# Patient Record
Sex: Female | Born: 1960 | Race: White | Hispanic: No | Marital: Married | Smoking: Never smoker
Health system: Southern US, Community
[De-identification: ages and names within clinical notes are randomized; demographics above are authoritative.]

## PROBLEM LIST (undated history)

## (undated) DIAGNOSIS — I10 Essential (primary) hypertension: Secondary | ICD-10-CM

## (undated) DIAGNOSIS — F32A Depression, unspecified: Secondary | ICD-10-CM

## (undated) DIAGNOSIS — N816 Rectocele: Secondary | ICD-10-CM

## (undated) DIAGNOSIS — G4700 Insomnia, unspecified: Secondary | ICD-10-CM

## (undated) DIAGNOSIS — F329 Major depressive disorder, single episode, unspecified: Secondary | ICD-10-CM

## (undated) DIAGNOSIS — R51 Headache: Secondary | ICD-10-CM

## (undated) DIAGNOSIS — R519 Headache, unspecified: Secondary | ICD-10-CM

## (undated) DIAGNOSIS — E162 Hypoglycemia, unspecified: Secondary | ICD-10-CM

## (undated) DIAGNOSIS — K589 Irritable bowel syndrome without diarrhea: Secondary | ICD-10-CM

## (undated) DIAGNOSIS — R609 Edema, unspecified: Secondary | ICD-10-CM

## (undated) DIAGNOSIS — K219 Gastro-esophageal reflux disease without esophagitis: Secondary | ICD-10-CM

## (undated) DIAGNOSIS — J309 Allergic rhinitis, unspecified: Secondary | ICD-10-CM

## (undated) HISTORY — DX: Major depressive disorder, single episode, unspecified: F32.9

## (undated) HISTORY — PX: APPENDECTOMY: SHX54

## (undated) HISTORY — DX: Depression, unspecified: F32.A

## (undated) HISTORY — DX: Irritable bowel syndrome, unspecified: K58.9

## (undated) HISTORY — DX: Hypoglycemia, unspecified: E16.2

## (undated) HISTORY — DX: Allergic rhinitis, unspecified: J30.9

## (undated) HISTORY — DX: Insomnia, unspecified: G47.00

## (undated) HISTORY — PX: OTHER SURGICAL HISTORY: SHX169

## (undated) HISTORY — DX: Rectocele: N81.6

## (undated) HISTORY — DX: Edema, unspecified: R60.9

## (undated) HISTORY — DX: Gastro-esophageal reflux disease without esophagitis: K21.9

## (undated) HISTORY — DX: Headache, unspecified: R51.9

## (undated) HISTORY — DX: Headache: R51

## (undated) HISTORY — DX: Essential (primary) hypertension: I10

---

## 1992-02-26 HISTORY — PX: FEMUR FRACTURE SURGERY: SHX633

## 1997-11-12 ENCOUNTER — Emergency Department (HOSPITAL_COMMUNITY): Admission: EM | Admit: 1997-11-12 | Discharge: 1997-11-12 | Payer: Self-pay | Admitting: Emergency Medicine

## 2001-02-25 HISTORY — PX: CHOLECYSTECTOMY: SHX55

## 2001-12-14 ENCOUNTER — Emergency Department (HOSPITAL_COMMUNITY): Admission: EM | Admit: 2001-12-14 | Discharge: 2001-12-15 | Payer: Self-pay | Admitting: Emergency Medicine

## 2001-12-15 ENCOUNTER — Encounter: Payer: Self-pay | Admitting: Surgery

## 2001-12-15 ENCOUNTER — Encounter: Payer: Self-pay | Admitting: Emergency Medicine

## 2001-12-15 ENCOUNTER — Encounter (INDEPENDENT_AMBULATORY_CARE_PROVIDER_SITE_OTHER): Payer: Self-pay | Admitting: Specialist

## 2001-12-15 ENCOUNTER — Inpatient Hospital Stay (HOSPITAL_COMMUNITY): Admission: EM | Admit: 2001-12-15 | Discharge: 2001-12-18 | Payer: Self-pay | Admitting: Emergency Medicine

## 2001-12-16 ENCOUNTER — Encounter: Payer: Self-pay | Admitting: Gastroenterology

## 2003-08-25 ENCOUNTER — Other Ambulatory Visit: Admission: RE | Admit: 2003-08-25 | Discharge: 2003-08-25 | Payer: Self-pay | Admitting: Family Medicine

## 2004-06-21 ENCOUNTER — Ambulatory Visit: Payer: Self-pay | Admitting: Gastroenterology

## 2004-07-25 ENCOUNTER — Ambulatory Visit: Payer: Self-pay | Admitting: Gastroenterology

## 2004-12-23 ENCOUNTER — Encounter: Admission: RE | Admit: 2004-12-23 | Discharge: 2004-12-23 | Payer: Self-pay | Admitting: *Deleted

## 2005-07-25 ENCOUNTER — Emergency Department (HOSPITAL_COMMUNITY): Admission: EM | Admit: 2005-07-25 | Discharge: 2005-07-25 | Payer: Self-pay | Admitting: Emergency Medicine

## 2005-09-10 ENCOUNTER — Ambulatory Visit (HOSPITAL_BASED_OUTPATIENT_CLINIC_OR_DEPARTMENT_OTHER): Admission: RE | Admit: 2005-09-10 | Discharge: 2005-09-10 | Payer: Self-pay | Admitting: Orthopaedic Surgery

## 2006-07-14 ENCOUNTER — Encounter: Admission: RE | Admit: 2006-07-14 | Discharge: 2006-07-14 | Payer: Self-pay | Admitting: Family Medicine

## 2009-01-04 ENCOUNTER — Ambulatory Visit: Payer: Self-pay | Admitting: Family Medicine

## 2009-01-08 ENCOUNTER — Emergency Department (HOSPITAL_BASED_OUTPATIENT_CLINIC_OR_DEPARTMENT_OTHER): Admission: EM | Admit: 2009-01-08 | Discharge: 2009-01-08 | Payer: Self-pay | Admitting: Emergency Medicine

## 2009-01-08 ENCOUNTER — Ambulatory Visit: Payer: Self-pay | Admitting: Diagnostic Radiology

## 2009-06-23 ENCOUNTER — Encounter (INDEPENDENT_AMBULATORY_CARE_PROVIDER_SITE_OTHER): Payer: Self-pay | Admitting: *Deleted

## 2010-03-29 NOTE — Letter (Signed)
Summary: Colonoscopy Letter  [facility]  [address] [place], [place] [address]   Phone: [phone]  Fax: [phone]      June 23, 2009 MRN: [id number]   [facility] [address] [place], [place]  [address]   Dear Ms. Caroline Davis,   According to your medical record, it is time for you to schedule a Colonoscopy. The American Cancer Society recommends this procedure as a method to detect early colon cancer. Patients with a family history of colon cancer, or a personal history of colon polyps or inflammatory bowel disease are at increased risk.  This letter has beeen generated based on the recommendations made at the time of your procedure. If you feel that in your particular situation this may no longer apply, please contact our office.  Please call our office at [phone] to schedule this appointment or to update your records at your earliest convenience.  Thank you for cooperating with [place] to provide you with the very best care possible.   Sincerely,  Judie Petit T. Russella Dar, M.D.  [facility] [phone]
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
## 2010-07-13 NOTE — Op Note (Signed)
NAMELAURABELLE, GORCZYCA                          ACCOUNT NO.:  [id number]   MEDICAL RECORD NO.:  [id number]                   PATIENT TYPE:  OBV   LOCATION:  0369                                 FACILITY:  [facility]   PHYSICIAN:  Velora Heckler, M.D.                DATE OF BIRTH:  May 02, 1960   DATE OF PROCEDURE:  12/15/2001  DATE OF DISCHARGE:                                 OPERATIVE REPORT   PREOPERATIVE DIAGNOSIS:  Unrelenting biliary colic, cholelithiasis, rule out  choledocholithiasis.   POSTOPERATIVE DIAGNOSIS:  Unrelenting biliary colic, cholelithiasis,  choledocholithiasis.   PROCEDURE:  Laparoscopic cholecystectomy with intraoperative  cholangiography.   SURGEON:  Velora Heckler, M.D.   ANESTHESIA:  General.   ESTIMATED BLOOD LOSS:  100 cc.   PREPARATION:  Betadine.   COMPLICATIONS:  None.   INDICATIONS:  The patient is a 50 year old white female who presents to the  emergency department with a two-week history of intermittent epigastric  abdominal pain.  Pain has been largely unrelenting over the past 24 hours.  She was seen on 10/20 in the emergency department at [facility].  Laboratory studies were normal.  The patient was given pain medication and  asked to return for an ultrasound.  The patient returned on 10/21 and  underwent abdominal ultrasound showing multiple gallstones.  Repeat  laboratory studies, however, showed elevated liver function tests.  General  surgery was consulted.  The patient was seen and prepared for the operating  room.   DESCRIPTION OF PROCEDURE:  The procedure was done in OR #1 at the [facility].  The patient was brought to the operating room and  placed in a supine position on the operating room table.  Following  administration of general anesthesia, the patient was prepped and draped in  the usual strict aseptic fashion.  After ascertaining that an adequate level  of anesthesia had been obtained, an  infraumbilical incision is made in the  midline with a #15 blade.  Dissection is carried down to the fascia.  The  fascia is incised in the midline, and the peritoneal cavity is entered  cautiously.  A 0 Vicryl pursestring suture is placed in the fascia.  A  Hasson cannula is introduced under direct vision and secured with a  pursestring suture.  The abdomen is insufflated with carbon dioxide.  Laparoscope is introduced and the abdomen explored.  Operative ports are  placed along the right costal margin in the midline, midclavicular line, and  anterior axillary line.  Fundus of the gallbladder is grasped and retracted  cephalad.  There is some edema of the gallbladder wall.  There are no  adhesions.  Dissection is begun at the neck of the gallbladder.  The cystic  duct is dissected out along its length.  A clip is placed at the neck of the  gallbladder.  Cystic duct is incised.  A Cook cholangiography catheter is  introduced into the peritoneal cavity through a stab wound in the right  upper quadrant.  It is inserted into the cystic duct and secured with a  Ligaclip.  Using C-arm fluoroscopy, real-time cholangiography is performed.  There is rapid filling of a normal-caliber common bile duct.  There is free  flow into the duodenum.  There is reflux of contrast into the right and left  ductal systems.  However, there is approximately a 3 mm filling defect in  the mid-common bile duct.  This is distal to the takeoff of the cystic duct.  Due to the cystic duct entering the common bile duct at a very acute angle,  it is technically difficult if not impossible to perform a laparoscopic  common bile duct exploration.  Therefore, no attempt is made to retrieve the  stone.  The clip and Cook catheter are withdrawn.  The cystic duct is triply  clipped and divided.  The cystic artery is dissected out along its length,  doubly clipped, and divided.  The gallbladder is then excised from the   gallbladder bed using the hook electrocautery for hemostasis.  At one point  in the upper midportion of the gallbladder bed, a portal venous radicle is  entered.  Hemostasis was not adequately obtained with electrocautery alone.  Therefore, a large Ligaclip is placed.  Two additional Ligaclip's are placed  with adequate control of bleeding.  Bleeding is watched carefully for  several minutes, and then a piece of Surgicel was placed into the  gallbladder bed.  A 19 [place] Blake drain is also placed beneath the  gallbladder bed to monitor for any further bleeding.  The drain is brought  out through a lateral port site and secured to the skin with a 3-0 nylon  suture.  The right upper quadrant is copiously irrigated with warm saline,  which is evacuated.  Good hemostasis is noted.  The gallbladder was  completely excised and extracted through the umbilical port without  difficulty.  It contains multiple gallstones.  The umbilical wound is closed  with a 0 Vicryl pursestring suture.  Pneumoperitoneum is released, and ports  are removed under direct visualization with good hemostasis noted at port  sites.  Port sites are anesthetized with local anesthetic.  Wounds are  closed with interrupted 4-0 Vicryl subcuticular sutures.  Blake drain is  placed to bulb suction.  Steri-Strips are applied to abdominal wounds.  Dressings are applied.  The patient is awakened from anesthesia and brought  to the recovery room in stable condition.  The patient tolerated the  procedure well.  Gastroenterology will be consulted to evaluate for need for  ERCP and stone extraction.                                               Velora Heckler, M.D.    TMG/MEDQ  D:  12/15/2001  T:  12/16/2001  Job:  [id number]

## 2010-07-13 NOTE — Op Note (Signed)
Caroline Davis, Caroline Davis              ACCOUNT NO.:  [id number]   MEDICAL RECORD NO.:  [id number]          PATIENT TYPE:  AMB   LOCATION:  DSC                          FACILITY:  [facility]   PHYSICIAN:  Lubertha Basque. Dalldorf, M.D.DATE OF BIRTH:  05/12/1960   DATE OF PROCEDURE:  09/10/2005  DATE OF DISCHARGE:                                 OPERATIVE REPORT   PREOPERATIVE DIAGNOSES:  1.  Right shoulder impingement.  2.  Right shoulder rotator cuff tear.   POSTOPERATIVE DIAGNOSES:  1.  Right shoulder impingement.  2.  Right shoulder rotator cuff tear.   PROCEDURE:  1.  Right shoulder arthroscopic acromioplasty.  2.  Right shoulder arthroscopic rotator cuff repair.   ANESTHESIA:  General and block.   ATTENDING SURGEON:  Lubertha Basque. Jerl Santos, M.D.   ASSISTANT:  Lindwood Qua, P.A.   INDICATIONS FOR PROCEDURE:  The patient is a 50 year old woman with a long  history of right shoulder pain.  She underwent an MRI scan which shows a  retracted rotator cuff tear.  She subsequently fell again.  At this point  she cannot bring her arm up overhead.  She is suspected to have an even  larger rotator cuff tear.  She has pain which limits her ability to rest and  obviously cannot use her arm.  At this point she is offered an arthroscopy  with repair of rotator cuff tear.  Informed operative consent was obtained  after discussion of the possible complications of reaction to anesthesia,  infection, failure of repair, and shoulder stiffness.   SUMMARY OF FINDINGS AND PROCEDURE:  Under general anesthesia and a block  through three portals, an arthroscopy of the right shoulder was performed.  The glenohumeral joint showed no degenerative changes and the biceps tendon  appeared normal in its native position.  The labral structures were all  intact.  She had an obvious large rotator cuff tear which was retracted but  very mobile.  Her tissues seemed good.  I performed a repair  arthroscopically with four  sutures.  I also performed an acromioplasty to  decompress the area.  Bryna Colander assisted throughout and was invaluable  to the completion of this case in that he held arthroscopic equipment so I  could pass sutures and tie knots.  He also closed portals simultaneously to  minimize OR time.   DESCRIPTION OF PROCEDURE:  The patient was taken to an operating suite where  general anesthetic was applied without difficulty.  She was also given a  block in the preanesthesia area.  She was positioned in beach-chair position  and prepped and draped in normal sterile fashion.  After administration of  preop IV Kefzol, an arthroscopy of the right shoulder was performed through  a total of three portals.  The findings were as noted above.  The procedure  consisted of an acromioplasty done through the lateral portal followed by  transfer of the bur to the posterior portal.  She had a moderately prominent  subacromial morphology.  The [facility] joint appeared benign and was not addressed.  She did have the aforementioned rotator cuff  tear which was retracted a  centimeter or two but very mobile.  I freed this up slightly.  I created a  bleeding bed of bone with a bur sacrificing about 5 or 6 mm of articular  cartilage so as to medialize this slightly and remove all tension off the  repair.  I then placed two of the Arthrex anchors 5.5 mm about a centimeter  apart.  A total of four sutures emanated from these anchors.  I then used  the Scorpion device to pass four limbs of these sutures through the rotator  cuff in appropriate position to reapproximate the cuff to the bleeding bed  of bone which we had created.  I then tied each of these sutures  sequentially and brought the cuff into the appropriate position.  We seemed  to achieve a nice stable repair.  Bryna Colander assisted throughout.  The  shoulder was again examined arthroscopically and the equipment was removed.  The shoulder was thoroughly  irrigated prior to removal of the equipment.  Simple sutures of nylon were used to loosely reapproximate the portals  followed by Adaptic and dry gauze dressing with tape.  Estimated blood loss  and intraoperative fluids can be obtained from anesthesia records.   DISPOSITION:  The patient was extubated in the operating room and taken to  recovery room in stable addition.  She was to go home same-day and to follow  up in the office in less than a week.  I will contact her by phone tonight.      Lubertha Basque Jerl Santos, M.D.  Electronically Signed     PGD/MEDQ  D:  09/10/2005  T:  09/10/2005  Job:  [id number]

## 2010-07-13 NOTE — Discharge Summary (Signed)
   Caroline Davis, Caroline Davis                          ACCOUNT NO.:  [id number]   MEDICAL RECORD NO.:  [id number]                   PATIENT TYPE:  INP   LOCATION:  0369                                 FACILITY:  [facility]   PHYSICIAN:  Velora Heckler, M.D.                DATE OF BIRTH:  1960/11/10   DATE OF ADMISSION:  12/15/2001  DATE OF DISCHARGE:  12/18/2001                                 DISCHARGE SUMMARY   REASON FOR ADMISSION:  Abdominal pain.   BRIEF HISTORY:  The patient is a 50 year old white female who presents with  two week history of right upper quadrant and epigastric abdominal pain.  This has been intermittent.  Over the past 24 hours it has become  unrelenting.  The patient presented to the emergency department on the day  of admission.  She was noted to have elevated liver function tests.  Ultrasound of the abdomen demonstrated multiple gallstones.  General surgery  was consulted and the patient was admitted on the general surgical service.   HOSPITAL COURSE:  The patient was admitted and prepared for the operating  room.  She was taken to the operating room on the evening of December 15, 2001.  She underwent laparoscopic cholecystectomy with intraoperative  cholangiography.  The patient was found at surgery on cholangiography to  have what appeared to be a 3mm gallstone in the mid common bile duct without  significant ductal dilatation.  Postoperatively the patient was seen in  consultation by gastroenterology.  She was prepared and taken to the  endoscopy suite on October 22 where she underwent ERCP with sphincterotomy.  The patient did well and her diet was advanced.  She was prepared for  discharge home on the third postoperative day.   DISCHARGE PLANNING:  The patient is discharged home December 18, 2001 in good  condition, tolerating a regular diet, and ambulating independently.  Discharge medications include Vicodin as needed for pain.  The patient will  return to see  me at [facility] in three weeks.   FINAL DIAGNOSES:  1. Acute cholecystitis.  2. Cholelithiasis.  3. Choledocholithiasis.   CONDITION ON DISCHARGE:  Improved.                                               Velora Heckler, M.D.    TMG/MEDQ  D:  12/24/2001  T:  12/24/2001  Job:  [id number]   cc:   Everardo All. Madilyn Fireman, M.D.  [address], [address]  [place]  [place] [address]  Fax: [phone]

## 2010-07-13 NOTE — Op Note (Signed)
Caroline Davis, Caroline Davis                          ACCOUNT NO.:  [id number]   MEDICAL RECORD NO.:  [id number]                   PATIENT TYPE:  OBV   LOCATION:  0369                                 [facility]ACILITY[facility]  [facility]   PHYSICIAN:  John C. Madilyn Fireman, M.D.                 DATE OF BIRTH:  May 17, 1960   DATE OF PROCEDURE:  12/16/2001  DATE OF DISCHARGE:                                 OPERATIVE REPORT   PROCEDURE:  Endoscopic retrograde cholangiopancreatography with  sphincterotomy.   INDICATIONS FOR PROCEDURE:  Common bile duct stone versus air bubble seen at  time of laparoscopic cholecystectomy yesterday for symptomatic  cholecystitis.  She also had elevated liver function tests.   PROCEDURE:  The patient was placed in the prone position and placed on the  pulse monitor with continuous low-flow oxygen delivered by nasal cannula.  She was sedated with 60 mg IV Demerol and 7 mg IV Versed and also given 0.4  mg IM atropine prior to the procedure.  The Olympus side-viewing endoscope  was advanced blindly into the oropharynx, esophagus, and stomach.  The  duodenum was entered and the ampulla of Vater located on the medially  duodenal wall.  It had a normal appearance.  Initial cannulation attempt  with the Wilson-Cook sphinctertome resulted in a show cannulation and  initial dye injection opacified a normal-appearing pancreatic duct.  With  use of the guidewire, the common bile duct was selectively cannulated and  cholangiogram obtained which initially revealed no obvious filling defect.  Based on the relatively strong clinical suspicion of stone, I went ahead and  performed sphincterotomy.  During live imaging of fluoroscopy, there  appeared to be a possible filling defect in the distal duct, but I could not  be certain that it was not an air bubble occurring after the sphincterotomy.  The common bile duct and intrahepatic ducts generally appeared normal.  The  sphinctertome was withdrawn and  replaced with  8.5-mm balloon catheter.  The catheter was then passed high into the common  hepatic duct and at this point, two or three small filling defects were seen  which were probably air bubbles.  Four balloon sweeps were made and only  bubbles were seen to be delivered into the duodenum with no stones seen.  The final cholangiogram was obtained which showed two or three additional  large air bubbles but no other obvious filling defects and one more balloon  sweep was made with no stones seen.  The scope was then withdrawn and the  patient returned to the recovery room in stable condition.  She tolerated  the procedure well and there were no immediate complications.   IMPRESSION:  Question of small common bile duct stone or possibly air  bubble, status post sphincterotomy and several balloon sweeps.   PLAN:  1. Recheck liver function tests tomorrow.  2. Advance diet as tolerated.  John C. Madilyn Fireman, M.D.    JCH/MEDQ  D:  12/16/2001  T:  12/16/2001  Job:  [id number]   cc:   Velora Heckler, M.D.  Fax: [phone]

## 2010-09-10 ENCOUNTER — Other Ambulatory Visit: Payer: Self-pay | Admitting: Family Medicine

## 2010-09-10 DIAGNOSIS — Z1231 Encounter for screening mammogram for malignant neoplasm of breast: Secondary | ICD-10-CM

## 2010-09-13 ENCOUNTER — Ambulatory Visit: Payer: Self-pay

## 2010-09-14 ENCOUNTER — Telehealth: Payer: Self-pay

## 2010-09-17 NOTE — Telephone Encounter (Signed)
Pt. returned call and said she will have to check her sch'd and call back to sch'd COL.

## 2012-11-10 ENCOUNTER — Ambulatory Visit
Admission: RE | Admit: 2012-11-10 | Discharge: 2012-11-10 | Disposition: A | Discharge status: Home / Self Care | Payer: 59 | Source: Ambulatory Visit | Attending: Family Medicine | Admitting: Family Medicine

## 2012-11-10 ENCOUNTER — Other Ambulatory Visit: Payer: Self-pay | Admitting: Family Medicine

## 2012-11-10 DIAGNOSIS — R0789 Other chest pain: Secondary | ICD-10-CM

## 2012-11-20 ENCOUNTER — Encounter: Payer: Self-pay | Admitting: Cardiovascular Disease

## 2012-11-20 ENCOUNTER — Other Ambulatory Visit: Payer: Self-pay | Admitting: *Deleted

## 2012-11-20 ENCOUNTER — Encounter: Payer: Self-pay | Admitting: *Deleted

## 2012-11-20 ENCOUNTER — Ambulatory Visit (INDEPENDENT_AMBULATORY_CARE_PROVIDER_SITE_OTHER): Payer: 59 | Admitting: Cardiovascular Disease

## 2012-11-20 VITALS — BP 162/90 | HR 72 | Ht 64.0 in | Wt 149.0 lb

## 2012-11-20 DIAGNOSIS — N816 Rectocele: Secondary | ICD-10-CM | POA: Insufficient documentation

## 2012-11-20 DIAGNOSIS — I10 Essential (primary) hypertension: Secondary | ICD-10-CM

## 2012-11-20 DIAGNOSIS — R609 Edema, unspecified: Secondary | ICD-10-CM | POA: Insufficient documentation

## 2012-11-20 DIAGNOSIS — J309 Allergic rhinitis, unspecified: Secondary | ICD-10-CM | POA: Insufficient documentation

## 2012-11-20 DIAGNOSIS — F329 Major depressive disorder, single episode, unspecified: Secondary | ICD-10-CM

## 2012-11-20 DIAGNOSIS — R0600 Dyspnea, unspecified: Secondary | ICD-10-CM

## 2012-11-20 DIAGNOSIS — K219 Gastro-esophageal reflux disease without esophagitis: Secondary | ICD-10-CM | POA: Insufficient documentation

## 2012-11-20 DIAGNOSIS — F32A Depression, unspecified: Secondary | ICD-10-CM | POA: Insufficient documentation

## 2012-11-20 DIAGNOSIS — K589 Irritable bowel syndrome without diarrhea: Secondary | ICD-10-CM | POA: Insufficient documentation

## 2012-11-20 DIAGNOSIS — E162 Hypoglycemia, unspecified: Secondary | ICD-10-CM | POA: Insufficient documentation

## 2012-11-20 DIAGNOSIS — G4700 Insomnia, unspecified: Secondary | ICD-10-CM | POA: Insufficient documentation

## 2012-11-20 DIAGNOSIS — R51 Headache: Secondary | ICD-10-CM

## 2012-11-20 MED ORDER — METHYLDOPA 250 MG PO TABS: 250.0000 mg | ORAL_TABLET | Freq: Two times a day (BID) | ORAL | Status: DC

## 2012-11-20 NOTE — Assessment & Plan Note (Signed)
On SNRI and may be better to change to SSRI with BP issues

## 2012-11-20 NOTE — Progress Notes (Signed)
Patient ID: Caroline Davis, female   DOB: 09/10/1960, 52 y.o.   MRN: [id number] 52 yo with long standing HTN.  Not clear what meds she has been on or w/u in past.  Rx since high school days.  Works at [employer].  Wakes up at 2:00 and works 3rd shift.  Only takes Diovan HCTZ.  Is intolerant to beta blockers with flat affect.  Has had marked edema with norvasc.  She does not have excess salt or ETOH.  No chronic kidney disease.  Some depression on effexor.  K runs low on diuretic and she suppl.  No known vascular diseae   ROS: Denies fever, malais, weight loss, blurry vision, decreased visual acuity, cough, sputum, SOB, hemoptysis, pleuritic pain, palpitaitons, heartburn, abdominal pain, melena, lower extremity edema, claudication, or rash.  All other systems reviewed and negative   General: Affect appropriate Healthy:  appears stated age HEENT: normal Neck supple with no adenopathy JVP normal no bruits no thyromegaly Lungs clear with no wheezing and good diaphragmatic motion Heart:  S1/S2 no murmur,rub, gallop or click PMI normal Abdomen: benighn, BS positve, no tenderness, no AAA no bruit.  No HSM or HJR Distal pulses intact with no bruits No edema Neuro non-focal Skin warm and dry No muscular weakness  Medications Current Outpatient Prescriptions  Medication Sig Dispense Refill  . pantoprazole (PROTONIX) 40 MG tablet Take 40 mg by mouth daily.      . potassium chloride (MICRO-K) 10 MEQ CR capsule Take 10 mEq by mouth 2 (two) times daily.      . valsartan-hydrochlorothiazide (DIOVAN-HCT) 320-25 MG per tablet Take 1 tablet by mouth daily.      Marland Kitchen venlafaxine (EFFEXOR) 75 MG tablet Take 75 mg by mouth 2 (two) times daily.      Marland Kitchen zolpidem (AMBIEN) 10 MG tablet Take 10 mg by mouth at bedtime as needed for sleep.       No current facility-administered medications for this visit.    Allergies Review of patient's allergies indicates not on file.  Family History: No family history on  file.  Social History: History   Social History  . Marital Status: Single    Spouse Name: N/A    Number of Children: N/A  . Years of Education: N/A   Occupational History  . Not on file.   Social History Main Topics  . Smoking status: Never Smoker   . Smokeless tobacco: Not on file  . Alcohol Use: No  . Drug Use: No  . Sexual Activity: Not on file   Other Topics Concern  . Not on file   Social History Narrative  . No narrative on file    Electrocardiogram:  NSR rate 72 normal with no LVH  Assessment and Plan

## 2012-11-20 NOTE — Assessment & Plan Note (Signed)
Check urine for pheo and renal duplex  Add aldomet .  If she appears better controlled can f/u with 24 hour ambulatory monitoring to make sure

## 2012-11-20 NOTE — Patient Instructions (Addendum)
Your physician recommends that you schedule a follow-up appointment in: NEXT AVAILABLE Your physician has recommended you make the following change in your medication:  WILL CALL LATER WITH  NAME OF NEW  MED Your physician has requested that you have a renal artery duplex. During this test, an ultrasound is used to evaluate blood flow to the kidneys. Allow one hour for this exam. Do not eat after midnight the day before and avoid carbonated beverages. Take your medications as you usually do.  Your physician recommends that you return for lab work in:   24 HOUR  URINE  WITH META  AND CATA Your physician has requested that you have an echocardiogram. Echocardiography is a painless test that uses sound waves to create images of your heart. It provides your doctor with information about the size and shape of your heart and how well your heart's chambers and valves are working. This procedure takes approximately one hour. There are no restrictions for this procedure.

## 2012-11-20 NOTE — Assessment & Plan Note (Signed)
Related to norvasc  Try to avoid calcium blockers if possible  On low dose diuretic

## 2012-11-25 ENCOUNTER — Other Ambulatory Visit: Payer: 59

## 2012-11-25 ENCOUNTER — Telehealth: Payer: Self-pay | Admitting: *Deleted

## 2012-11-25 NOTE — Telephone Encounter (Signed)
LMTCB  RE  IF  URINE HAS  BEEN DONE .Caroline Davis

## 2012-11-25 NOTE — Telephone Encounter (Signed)
Message copied by Alois Cliche on Wed Nov 25, 2012 10:34 AM ------      Message from: Wendall Stade      Created: Wed Nov 25, 2012 12:16 AM       ? Has this been done      ----- Message -----         From: SYSTEM         Sent: 11/25/2012  12:08 AM           To: Wendall Stade, MD                   ------

## 2012-11-26 NOTE — Telephone Encounter (Signed)
LMTCB ./CY 

## 2012-12-01 ENCOUNTER — Other Ambulatory Visit: Payer: 59

## 2012-12-01 DIAGNOSIS — I10 Essential (primary) hypertension: Secondary | ICD-10-CM

## 2012-12-03 ENCOUNTER — Ambulatory Visit (HOSPITAL_COMMUNITY): Payer: 59 | Attending: Cardiovascular Disease

## 2012-12-03 DIAGNOSIS — I10 Essential (primary) hypertension: Secondary | ICD-10-CM | POA: Insufficient documentation

## 2012-12-04 ENCOUNTER — Ambulatory Visit (HOSPITAL_COMMUNITY): Payer: 59 | Attending: Cardiovascular Disease | Admitting: Radiology

## 2012-12-04 ENCOUNTER — Other Ambulatory Visit (HOSPITAL_COMMUNITY): Payer: Self-pay | Admitting: Cardiovascular Disease

## 2012-12-04 DIAGNOSIS — R0989 Other specified symptoms and signs involving the circulatory and respiratory systems: Secondary | ICD-10-CM | POA: Insufficient documentation

## 2012-12-04 DIAGNOSIS — R0602 Shortness of breath: Secondary | ICD-10-CM | POA: Insufficient documentation

## 2012-12-04 DIAGNOSIS — R0600 Dyspnea, unspecified: Secondary | ICD-10-CM

## 2012-12-04 DIAGNOSIS — I10 Essential (primary) hypertension: Secondary | ICD-10-CM | POA: Insufficient documentation

## 2012-12-04 DIAGNOSIS — R0609 Other forms of dyspnea: Secondary | ICD-10-CM | POA: Insufficient documentation

## 2012-12-04 NOTE — Progress Notes (Signed)
Echocardiogram performed.  

## 2012-12-06 LAB — METANEPHRINES, URINE, 24 HOUR
Metaneph Total, Ur: 383 mcg/24 h (ref 224–832)
Metanephrines, Ur: 69 mcg/24 h — ABNORMAL LOW (ref 90–315)
Normetanephrine, 24H Ur: 314 mcg/24 h (ref 122–676)

## 2012-12-07 NOTE — Telephone Encounter (Signed)
URINE WAS DONE  SEE LABS./CY

## 2012-12-10 ENCOUNTER — Encounter: Payer: Self-pay | Admitting: *Deleted

## 2012-12-14 ENCOUNTER — Telehealth: Payer: Self-pay | Admitting: Cardiovascular Disease

## 2012-12-14 NOTE — Telephone Encounter (Signed)
Follow up  ° ° °Patient returning call back to nurse  °

## 2012-12-14 NOTE — Telephone Encounter (Signed)
PT  AWARE OF  TEST  RESULTS  ./CY 

## 2012-12-23 ENCOUNTER — Encounter: Payer: Self-pay | Admitting: Cardiovascular Disease

## 2012-12-23 ENCOUNTER — Telehealth: Payer: Self-pay | Admitting: Cardiovascular Disease

## 2012-12-23 ENCOUNTER — Ambulatory Visit (INDEPENDENT_AMBULATORY_CARE_PROVIDER_SITE_OTHER): Payer: 59 | Admitting: Cardiovascular Disease

## 2012-12-23 VITALS — BP 140/85 | HR 72 | Ht 64.0 in | Wt 149.1 lb

## 2012-12-23 DIAGNOSIS — I10 Essential (primary) hypertension: Secondary | ICD-10-CM

## 2012-12-23 DIAGNOSIS — R51 Headache: Secondary | ICD-10-CM

## 2012-12-23 NOTE — Assessment & Plan Note (Signed)
Improved with Aldomet Continue current meds

## 2012-12-23 NOTE — Assessment & Plan Note (Signed)
Discussed use of claritin and nasal washes and avoidence of pseudofed if at all possible

## 2012-12-23 NOTE — Patient Instructions (Signed)
Your physician wants you to follow-up in:  6 MONTHS WITH DR NISHAN  You will receive a reminder letter in the mail two months in advance. If you don't receive a letter, please call our office to schedule the follow-up appointment. Your physician recommends that you continue on your current medications as directed. Please refer to the Current Medication list given to you today. 

## 2012-12-23 NOTE — Telephone Encounter (Signed)
New problem  Patient is scheduled to come in Friday, she is unable to make that appointment because she is moving to [place] Patient wanted to know if she could come in today or earlier on Friday. Please advise

## 2012-12-23 NOTE — Progress Notes (Signed)
Patient ID: Caroline Davis, female   DOB: 03/09/1960, 52 y.o.   MRN: [id number] 52 yo with long standing HTN. Not clear what meds she has been on or w/u in past. Rx since high school days. Works at [employer]. Wakes up at 2:00 and works 3rd shift. Only takes Diovan HCTZ. Is intolerant to beta blockers with flat affect. Has had marked edema with norvasc. She does not have excess salt or ETOH. No chronic kidney disease. Some depression on effexor. K runs low on diuretic and she suppl. No known vascular diseae   Urine negative for pheo Renal duplex normal  Aldomet added and BP improved  Going to [place] to be with parents for 6 months  Occasionally has to use pseudofed for sinus headaches. Cautioned her about this   ROS: Denies fever, malais, weight loss, blurry vision, decreased visual acuity, cough, sputum, SOB, hemoptysis, pleuritic pain, palpitaitons, heartburn, abdominal pain, melena, lower extremity edema, claudication, or rash.  All other systems reviewed and negative  General: Affect appropriate Healthy:  appears stated age HEENT: normal Neck supple with no adenopathy JVP normal no bruits no thyromegaly Lungs clear with no wheezing and good diaphragmatic motion Heart:  S1/S2 no murmur, no rub, gallop or click PMI normal Abdomen: benighn, BS positve, no tenderness, no AAA no bruit.  No HSM or HJR Distal pulses intact with no bruits No edema Neuro non-focal Skin warm and dry No muscular weakness   Current Outpatient Prescriptions  Medication Sig Dispense Refill  . methyldopa (ALDOMET) 250 MG tablet Take 1 tablet (250 mg total) by mouth 2 (two) times daily.  60 tablet  11  . pantoprazole (PROTONIX) 40 MG tablet Take 40 mg by mouth daily.      . potassium chloride (MICRO-K) 10 MEQ CR capsule Take 10 mEq by mouth 2 (two) times daily.      Marland Kitchen venlafaxine (EFFEXOR) 75 MG tablet Take 75 mg by mouth 2 (two) times daily.      Marland Kitchen zolpidem (AMBIEN) 10 MG tablet Take 10 mg by mouth at bedtime as  needed for sleep.      . valsartan-hydrochlorothiazide (DIOVAN-HCT) 320-25 MG per tablet Take 1 tablet by mouth daily.       No current facility-administered medications for this visit.    Allergies  Beta adrenergic blockers and Amlodipine  Electrocardiogram:  11/20/12 SR rate 76 no LVH normal   Assessment and Plan

## 2012-12-24 NOTE — Telephone Encounter (Signed)
PT  WAS  SEEN 12-23-12 ./CY

## 2012-12-25 ENCOUNTER — Ambulatory Visit: Payer: 59 | Admitting: Cardiovascular Disease

## 2014-02-25 HISTORY — PX: OTHER SURGICAL HISTORY: SHX169

## 2014-04-21 ENCOUNTER — Encounter: Payer: Self-pay | Admitting: Gastroenterology

## 2014-09-22 ENCOUNTER — Ambulatory Visit (HOSPITAL_COMMUNITY)
Admission: RE | Admit: 2014-09-22 | Discharge: 2014-09-22 | Disposition: A | Discharge status: Home / Self Care | Payer: 59 | Source: Ambulatory Visit | Attending: Family Medicine | Admitting: Family Medicine

## 2014-09-22 ENCOUNTER — Other Ambulatory Visit (HOSPITAL_COMMUNITY): Payer: Self-pay | Admitting: Family Medicine

## 2014-09-22 ENCOUNTER — Other Ambulatory Visit: Payer: Self-pay | Admitting: Adult Health

## 2014-09-22 DIAGNOSIS — M79605 Pain in left leg: Secondary | ICD-10-CM | POA: Insufficient documentation

## 2014-09-22 DIAGNOSIS — R52 Pain, unspecified: Secondary | ICD-10-CM

## 2014-09-22 NOTE — Progress Notes (Signed)
VASCULAR LAB PRELIMINARY  PRELIMINARY  PRELIMINARY  PRELIMINARY  Left lower extremity venous Doppler completed.    Preliminary report:  There is no DVT or SVT noted in the left lower extremity.   Nai Borromeo, RVT 09/22/2014, 5:17 PM

## 2015-04-23 IMAGING — CR DG CHEST 2V
2 series · 2 of 2 positions shown · non-contrast
Comparison: Chest x-ray of 07/25/2005

CLINICAL DATA: Chest pressure, long history of hypertension

CHEST - 2 VIEW

[view not recorded (1 of 2)]
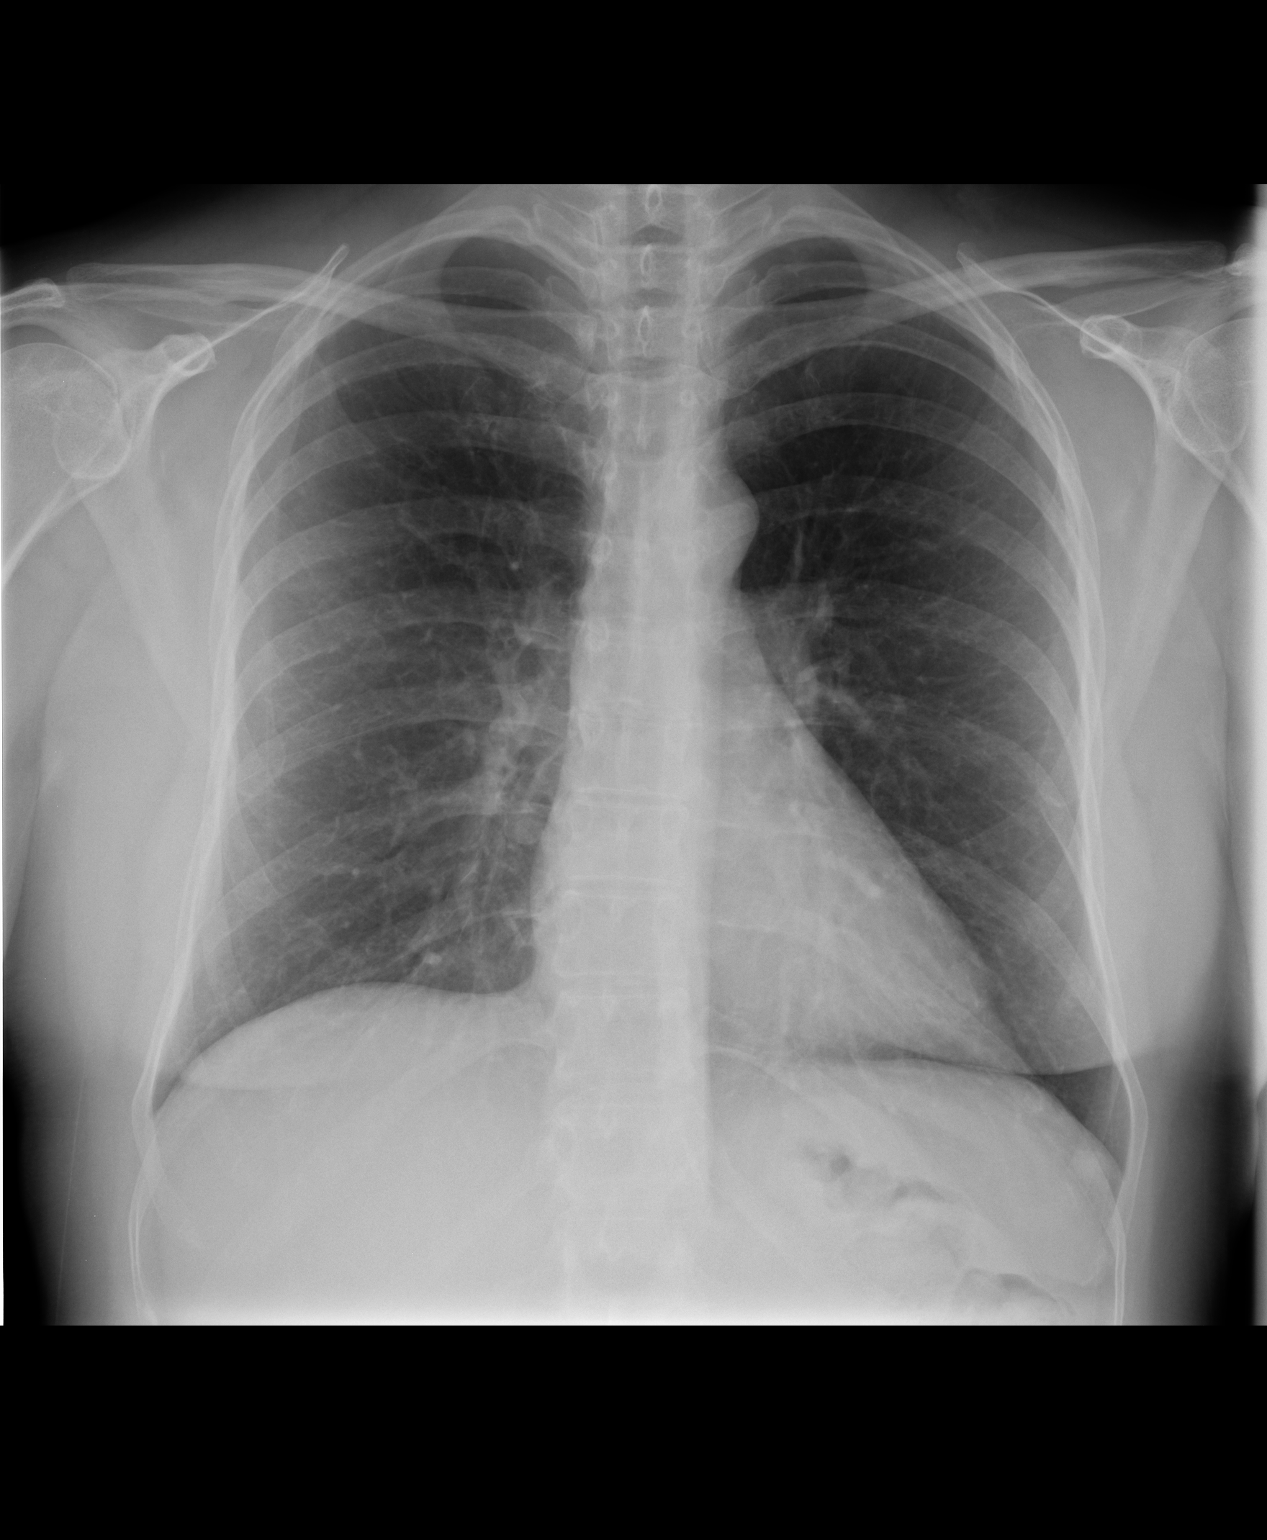

[view not recorded (2 of 2)]
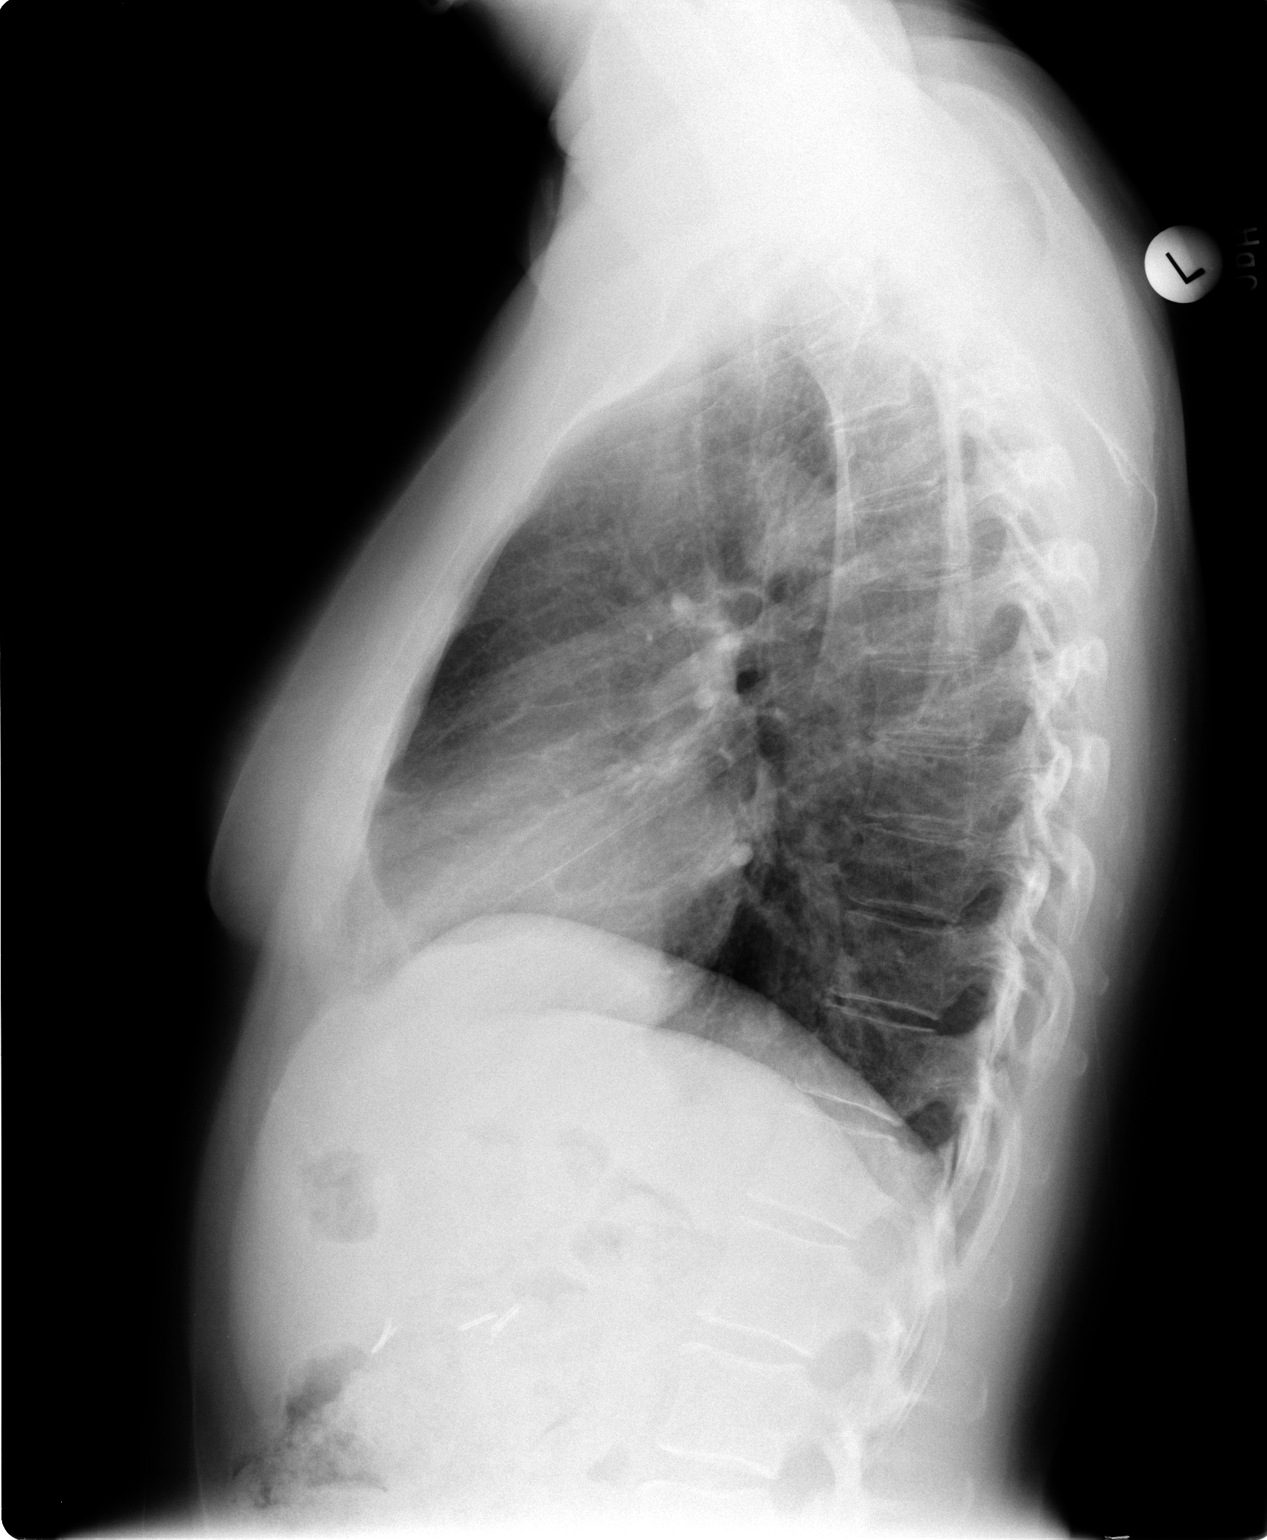

[2 of 2 positions shown; findings below may reference images not displayed]

FINDINGS: No active infiltrate or effusion is seen.  Mediastinal
contours are stable.  The heart is within normal limits in size.
No bony abnormality is noted.  Surgical clips are present in the
right upper quadrant.
IMPRESSION: Stable chest x-ray.  No active lung disease.

## 2016-05-01 ENCOUNTER — Other Ambulatory Visit: Payer: Self-pay | Admitting: Family Medicine

## 2016-05-01 DIAGNOSIS — I10 Essential (primary) hypertension: Secondary | ICD-10-CM

## 2016-05-01 DIAGNOSIS — R9431 Abnormal electrocardiogram [ECG] [EKG]: Secondary | ICD-10-CM

## 2016-05-01 DIAGNOSIS — R0789 Other chest pain: Secondary | ICD-10-CM

## 2016-05-02 ENCOUNTER — Encounter (INDEPENDENT_AMBULATORY_CARE_PROVIDER_SITE_OTHER): Payer: Self-pay

## 2016-05-02 ENCOUNTER — Ambulatory Visit (HOSPITAL_COMMUNITY): Payer: Managed Care, Other (non HMO) | Attending: Internal Medicine

## 2016-05-02 DIAGNOSIS — R079 Chest pain, unspecified: Secondary | ICD-10-CM | POA: Diagnosis not present

## 2016-05-02 DIAGNOSIS — R9431 Abnormal electrocardiogram [ECG] [EKG]: Secondary | ICD-10-CM

## 2016-05-02 DIAGNOSIS — I2510 Atherosclerotic heart disease of native coronary artery without angina pectoris: Secondary | ICD-10-CM | POA: Diagnosis present

## 2016-05-02 DIAGNOSIS — R0789 Other chest pain: Secondary | ICD-10-CM | POA: Diagnosis not present

## 2016-05-02 DIAGNOSIS — R002 Palpitations: Secondary | ICD-10-CM | POA: Diagnosis not present

## 2016-05-02 DIAGNOSIS — I10 Essential (primary) hypertension: Secondary | ICD-10-CM

## 2016-05-02 LAB — MYOCARDIAL PERFUSION IMAGING
CHL CUP NUCLEAR SDS: 3
CHL CUP NUCLEAR SSS: 12
CSEPPHR: 100 {beats}/min
LHR: 0.25
LV dias vol: 66 mL (ref 46–106)
LV sys vol: 18 mL
NUC STRESS TID: 1.05
Rest HR: 71 {beats}/min
SRS: 9

## 2016-05-02 MED ORDER — TECHNETIUM TC 99M TETROFOSMIN IV KIT
32.7000 | PACK | Freq: Once | INTRAVENOUS | Status: AC | PRN
  Administered 2016-05-02: 32.7 via INTRAVENOUS
  Filled 2016-05-02: qty 33

## 2016-05-02 MED ORDER — TECHNETIUM TC 99M TETROFOSMIN IV KIT
10.7000 | PACK | Freq: Once | INTRAVENOUS | Status: AC | PRN
  Administered 2016-05-02: 10.7 via INTRAVENOUS
  Filled 2016-05-02: qty 11

## 2016-05-02 MED ORDER — REGADENOSON 0.4 MG/5ML IV SOLN
0.4000 mg | Freq: Once | INTRAVENOUS | Status: AC
  Administered 2016-05-02: 0.4 mg via INTRAVENOUS

## 2017-05-25 ENCOUNTER — Encounter (HOSPITAL_COMMUNITY): Payer: Self-pay | Admitting: Emergency Medicine

## 2017-05-25 ENCOUNTER — Emergency Department (HOSPITAL_COMMUNITY)
Admission: EM | Admit: 2017-05-25 | Discharge: 2017-05-25 | Disposition: A | Discharge status: Home / Self Care | Attending: Emergency Medicine | Admitting: Emergency Medicine

## 2017-05-25 ENCOUNTER — Emergency Department (HOSPITAL_COMMUNITY)

## 2017-05-25 DIAGNOSIS — Z79899 Other long term (current) drug therapy: Secondary | ICD-10-CM | POA: Insufficient documentation

## 2017-05-25 DIAGNOSIS — R05 Cough: Secondary | ICD-10-CM | POA: Insufficient documentation

## 2017-05-25 DIAGNOSIS — I10 Essential (primary) hypertension: Secondary | ICD-10-CM | POA: Diagnosis not present

## 2017-05-25 LAB — I-STAT TROPONIN, ED: TROPONIN I, POC: 0 ng/mL (ref 0.00–0.08)

## 2017-05-25 MED ORDER — PREDNISONE 20 MG PO TABS
40.0000 mg | ORAL_TABLET | Freq: Once | ORAL | Status: AC
  Administered 2017-05-25: 40 mg via ORAL
  Filled 2017-05-25: qty 2

## 2017-05-25 MED ORDER — ALBUTEROL SULFATE HFA 108 (90 BASE) MCG/ACT IN AERS
2.0000 | INHALATION_SPRAY | RESPIRATORY_TRACT | Status: DC | PRN
  Administered 2017-05-25: 2 via RESPIRATORY_TRACT
  Filled 2017-05-25: qty 6.7

## 2017-05-25 MED ORDER — AEROCHAMBER PLUS FLO-VU MEDIUM MISC
1.0000 | Freq: Once | Status: DC
  Filled 2017-05-25: qty 1

## 2017-05-25 MED ORDER — CLONIDINE HCL 0.1 MG PO TABS
0.3000 mg | ORAL_TABLET | Freq: Once | ORAL | Status: AC
  Administered 2017-05-25: 0.3 mg via ORAL
  Filled 2017-05-25: qty 3

## 2017-05-25 NOTE — ED Provider Notes (Signed)
57 y.o. Female ho hypertension, asthma presents with cough and congestion from urgent care with hypertension followed by pmd and Dr. Donnie Ahoilley. She states it is 220 here and has been that elevated in the past but usually sbp 150-170.    Margarita Grizzleay, Shelvie Salsberry, MD 05/25/17 [phone]

## 2017-05-25 NOTE — ED Triage Notes (Signed)
[facility] There with complaints of cough. Hypertensive there. Hx of same. Has primary care dr. Currently taking bp meds.

## 2017-05-25 NOTE — Discharge Instructions (Signed)
Your blood work and chest x-ray were reassuring.  Take albuterol inhaler as needed for wheezing.  Continue taking your blood pressure medications as prescribed. Follow up with your regular doctor as we discussed for further blood pressure management. Check your blood pressure regularly at home.   Return to the ER if you have any new or concerning symptoms including chest pain, trouble breathing, trouble with your vision, worsening headache.

## 2017-05-25 NOTE — ED Provider Notes (Signed)
[facility]-EMERGENCY DEPT Provider Note   CSN: [id number] Arrival date & time: 05/25/17  1213     History   Chief Complaint Chief Complaint  Patient presents with  . Hypertension  . Cough    HPI Caroline Davis is a 57 y.o. female.  HPI  Caroline Davis is a 57yo female with a history of uncontrolled HTN, allergic rhinitis, headaches who [facility]. States that she has had hypertension since age 73yrs, has "been worked up many times and I have essential HTN." States that she has a close relationship with her PCP who frequently adjusts her blood pressure medications. States that she takes clonidine, losartan and HCTZ at home and took these medications as prescribed this morning. States that she checks her blood pressure at home and it is frequently >180/110. [facility] because her allergies flared up and she had a productive cough which started yesterday. Endorses congestion and post nasal drip. States that she feels chest tightness and has wheezing. Denies chest pain, shortness of breath, leg swelling. States that she also has a mild sinus headache which is bitemporal. Reports it feels like her normal headache and denies associated visual changes, n/v, focal numbness or weakness. Took Claritin DM and ibuprofen for her symptoms with some improvement. She does not use an inhaler. Denies fever, chills, ear pain, sore throat, body aches, neck pain/stiffness, abdominal pain, dysuria, lightheadedness, syncope.   Past Medical History:  Diagnosis Date  . Allergic rhinitis   . Depression   . Edema   . GERD (gastroesophageal reflux disease)   . Headache   . HTN (hypertension)   . Hypoglycemia   . IBS (irritable bowel syndrome)   . Insomnia   . Rectocele     Patient Active Problem List   Diagnosis Date Noted  . Allergic rhinitis   . Headache   . HTN  (hypertension)   . Depression   . Hypoglycemia   . Rectocele   . IBS (irritable bowel syndrome)   . GERD (gastroesophageal reflux disease)   . Edema   . Insomnia     History reviewed. No pertinent surgical history.   OB History   None      Home Medications    Prior to Admission medications   Medication Sig Start Date End Date Taking? Authorizing Provider  methyldopa (ALDOMET) 250 MG tablet Take 1 tablet (250 mg total) by mouth 2 (two) times daily. 11/20/12   Wendall Stade, MD  pantoprazole (PROTONIX) 40 MG tablet Take 40 mg by mouth daily.    [provider]  potassium chloride (MICRO-K) 10 MEQ CR capsule Take 10 mEq by mouth as directed.     [provider]  valsartan-hydrochlorothiazide (DIOVAN-HCT) 160-25 MG per tablet Take 1 tablet by mouth daily.    [provider]  venlafaxine (EFFEXOR) 75 MG tablet Take 75 mg by mouth 2 (two) times daily.    [provider]  zolpidem (AMBIEN) 10 MG tablet Take 10 mg by mouth at bedtime as needed for sleep.    [provider]    Family History No family history on file.  Social History Social History   Tobacco Use  . Smoking status: Never Smoker  . Smokeless tobacco: Never Used  Substance Use Topics  . Alcohol use: No  . Drug use: No     Allergies   Beta adrenergic blockers and  Amlodipine   Review of Systems Review of Systems  Constitutional: Negative for chills and fever.  HENT: Positive for congestion, postnasal drip and rhinorrhea. Negative for ear pain, sore throat and trouble swallowing.   Eyes: Negative for visual disturbance.  Respiratory: Positive for cough. Negative for shortness of breath and wheezing.   Cardiovascular: Negative for chest pain and leg swelling.  Gastrointestinal: Negative for abdominal pain, nausea and vomiting.  Genitourinary: Negative for difficulty urinating and dysuria.  Musculoskeletal: Negative for gait problem, neck pain and neck stiffness.    Skin: Negative for rash.  Neurological: Positive for headaches. Negative for dizziness, syncope, speech difficulty, weakness, light-headedness and numbness.  Psychiatric/Behavioral: Negative for agitation.     Physical Exam Updated Vital Signs BP (!) 174/97 (BP Location: Right Arm)   Pulse 74   Temp 98.1 F (36.7 C) (Oral)   Resp 14   SpO2 100%   Physical Exam  Constitutional: She is oriented to person, place, and time. She appears well-developed and well-nourished. No distress.  HENT:  Head: Normocephalic and atraumatic.  Mouth/Throat: Oropharynx is clear and moist. No oropharyngeal exudate.  Eyes: Pupils are equal, round, and reactive to light. Conjunctivae and EOM are normal. Right eye exhibits no discharge. Left eye exhibits no discharge.  Neck: Normal range of motion. Neck supple. No JVD present. No tracheal deviation present.  Cardiovascular: Normal rate, regular rhythm and intact distal pulses. Exam reveals no friction rub.  No murmur heard. Pulmonary/Chest: Effort normal. No stridor. No respiratory distress. She has no wheezes. She has no rales.  Abdominal: Soft. Bowel sounds are normal. There is no tenderness.  Musculoskeletal: Normal range of motion.  No leg swelling.   Neurological: She is alert and oriented to person, place, and time. Coordination normal.  Mental Status:  Alert, oriented, thought content appropriate, able to give a coherent history. Speech fluent without evidence of aphasia. Able to follow 2 step commands without difficulty.  Cranial Nerves:  II:  Peripheral visual fields grossly normal, pupils equal, round, reactive to light III,IV, VI: ptosis not present, extra-ocular motions intact bilaterally  V,VII: smile symmetric, facial light touch sensation equal VIII: hearing grossly normal to voice  X: uvula elevates symmetrically  XI: bilateral shoulder shrug symmetric and strong XII: midline tongue extension without fassiculations Motor:  Normal  tone. 5/5 in upper and lower extremities bilaterally including strong and equal grip strength and dorsiflexion/plantar flexion Sensory: Pinprick and light touch normal in all extremities.  Cerebellar: normal finger-to-nose with bilateral upper extremities Gait: normal gait and balance CV: distal pulses palpable throughout   Skin: Skin is warm and dry. Capillary refill takes less than 2 seconds. She is not diaphoretic.  Psychiatric: She has a normal mood and affect. Her behavior is normal.  Nursing note and vitals reviewed.    ED Treatments / Results  Labs (all labs ordered are listed, but only abnormal results are displayed) Labs Reviewed  I-STAT TROPONIN, ED    EKG None  Radiology Dg Chest 2 View  Result Date: 05/25/2017 CLINICAL DATA:  Productive cough. EXAM: CHEST - 2 VIEW COMPARISON:  None FINDINGS: The heart size and mediastinal contours are within normal limits. Both lungs are clear. The visualized skeletal structures are unremarkable. IMPRESSION: No active cardiopulmonary disease. Electronically Signed   By: Signa Kellaylor  Stroud M.D.   On: 05/25/2017 14:18    Procedures Procedures (including critical care time)  Medications Ordered in ED Medications  cloNIDine (CATAPRES) tablet 0.3 mg (0.3 mg Oral Given 05/25/17 1342)  predniSONE (DELTASONE) tablet 40 mg (40 mg Oral Given 05/25/17 1428)     Initial Impression / Assessment and Plan / ED Course  I have reviewed the triage vital signs and the nursing notes.  Pertinent labs & imaging results that were available during my care of the patient were reviewed by me and considered in my medical decision making (see chart for details).    Presents to the ER from urgent care for evaluation of hypertensive. Patient is hypertensive to 226/120 on arrival. Complaining of allergy flare up of cough, congestion and post-nasal drip. Reports bitemporal mild sinus headache, reports this is typical of her normal headache. Denies chest pain, SOB,  visual disturbance. She is taking her prescribed home bp medications, but did take Claritin D yesterday which may be contributing.   On exam she is in no apparent distress. Lungs CTA. No neurological deficits. Do not suspect acute stroke given presentation and symptoms. Do not suspect ACS given patient denies CP, troponin negative and EKG non-ischemic. No pulse deficits, widening of the mediastinum or neurologic symptoms to suggest TAD. CXR without acute abnormality, no pneumonia.    No concern for hypertensive emergency at this time. Patient given clonidine and had subsequent improvement in blood pressure to 174/97. Patient given an albuterol inhaler for cough symptoms. She declines prednisone, as she does not think this is necessary and has the potential to raise her blood pressure. I think this is appropriate given no wheezing heard on lung exam.  Counseled patient to follow-up with her primary doctor for blood pressure recheck and continued management.  Discussed reasons to return to the emergency department.  Patient agrees and voiced understanding to the above plan and has no complaints prior to discharge.  This was a shared visit with Dr. Rosalia Hammers who also saw the patient and agrees with plan.  Final Clinical Impressions(s) / ED Diagnoses   Final diagnoses:  Essential hypertension    ED Discharge Orders    None       Kellie Shropshire, PA-C 05/26/17 0000    Margarita Grizzle, MD 05/28/17 [id number]

## 2017-05-25 NOTE — ED Notes (Signed)
Patient transported to X-ray 

## 2018-01-06 ENCOUNTER — Other Ambulatory Visit: Payer: Self-pay | Admitting: Psychiatry

## 2018-02-15 ENCOUNTER — Encounter: Payer: Self-pay | Admitting: Emergency Medicine

## 2018-03-09 ENCOUNTER — Ambulatory Visit: Payer: Self-pay | Admitting: Psychiatry

## 2018-03-31 ENCOUNTER — Ambulatory Visit (INDEPENDENT_AMBULATORY_CARE_PROVIDER_SITE_OTHER): Admitting: Psychiatry

## 2018-03-31 ENCOUNTER — Encounter: Payer: Self-pay | Admitting: Psychiatry

## 2018-03-31 DIAGNOSIS — F411 Generalized anxiety disorder: Secondary | ICD-10-CM

## 2018-03-31 DIAGNOSIS — F5105 Insomnia due to other mental disorder: Secondary | ICD-10-CM | POA: Diagnosis not present

## 2018-03-31 DIAGNOSIS — F3342 Major depressive disorder, recurrent, in full remission: Secondary | ICD-10-CM | POA: Diagnosis not present

## 2018-03-31 MED ORDER — VENLAFAXINE HCL 75 MG PO TABS: 75.0000 mg | ORAL_TABLET | Freq: Three times a day (TID) | ORAL | 3 refills | Status: DC

## 2018-03-31 MED ORDER — ZOLPIDEM TARTRATE 10 MG PO TABS: ORAL_TABLET | ORAL | 1 refills | Status: DC

## 2018-03-31 NOTE — Progress Notes (Signed)
Caroline Davis [id number] May 12, 1960 58 y.o.  Subjective:   Patient ID:  Caroline Davis is a 58 y.o. (DOB [birth year]) female.  Chief Complaint: No chief complaint on file.   HPI last seen September 22, 2017 Caroline Davis presents to the office today for follow-up of depression anxiety and chronic insomnia.  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 Patient reports stable mood and denies depressed or irritable moods.  Patient denies any recent difficulty with anxiety.  Patient denies difficulty with sleep initiation or maintenance. Denies appetite disturbance.  Patient reports that energy and motivation have been good.  Patient denies any difficulty with concentration.  Patient denies any suicidal ideation.  Still uncontrolled hypertension resistant to therapy.  Past psychiatric med trials and sleep med trials include mirtazapine which cause restless legs, alprazolam, zolpidem, olanzapine, Abilify, risperidone, Effexor, sertraline, fluvoxamine, pindolol, lithium Review of Systems:  Review of Systems  Cardiovascular: Negative for chest pain.  Neurological: Positive for headaches. Negative for dizziness, tremors and weakness.  Psychiatric/Behavioral: Negative for agitation, behavioral problems, confusion, decreased concentration, dysphoric mood, hallucinations, self-injury, sleep disturbance and suicidal ideas. The patient is not nervous/anxious and is not hyperactive.     Medications: I have reviewed the patient's current medications.  Current Outpatient Medications  Medication Sig Dispense Refill  . methyldopa (ALDOMET) 250 MG tablet Take 1 tablet (250 mg total) by mouth 2 (two) times daily. 60 tablet 11  . pantoprazole (PROTONIX) 40 MG tablet Take 40 mg by mouth daily.    . potassium chloride (MICRO-K) 10 MEQ CR capsule Take 10 mEq by mouth as directed.     . valsartan-hydrochlorothiazide (DIOVAN-HCT) 160-25 MG per tablet Take 1 tablet by mouth daily.     Marland Kitchen venlafaxine (EFFEXOR) 75 MG tablet Take 75 mg by mouth 3 (three) times daily with meals.    Marland Kitchen zolpidem (AMBIEN) 10 MG tablet TAKE ONE TABLET BY MOUTH EVERY NIGHT AT BEDTIME 30 tablet 2   No current facility-administered medications for this visit.     Medication Side Effects: None  Allergies:  Allergies  Allergen Reactions  . Beta Adrenergic Blockers     DEPRESSION  . Amlodipine     EDEMA     Past Medical History:  Diagnosis Date  . Allergic rhinitis   . Depression   . Edema   . GERD (gastroesophageal reflux disease)   . Headache   . HTN (hypertension)   . Hypoglycemia   . IBS (irritable bowel syndrome)   . Insomnia   . Rectocele     No family history on file.  Social History   Socioeconomic History  . Marital status: Single    Spouse name: Not on file  . Number of children: Not on file  . Years of education: Not on file  . Highest education level: Not on file  Occupational History  . Not on file  Social Needs  . Financial resource strain: Not on file  . Food insecurity:    Worry: Not on file    Inability: Not on file  . Transportation needs:    Medical: Not on file    Non-medical: Not on file  Tobacco Use  . Smoking status: Never Smoker  . Smokeless tobacco: Never Used  Substance and Sexual Activity  . Alcohol use: No  . Drug use: No  . Sexual activity: Not on file  Lifestyle  . Physical activity:    Days per week: Not on file  Minutes per session: Not on file  . Stress: Not on file  Relationships  . Social connections:    Talks on phone: Not on file    Gets together: Not on file    Attends religious service: Not on file    Active member of club or organization: Not on file    Attends meetings of clubs or organizations: Not on file    Relationship status: Not on file  . Intimate partner violence:    Fear of current or ex partner: Not on file    Emotionally abused: Not on file    Physically abused: Not on file    Forced sexual  activity: Not on file  Other Topics Concern  . Not on file  Social History Narrative  . Not on file    Past Medical History, Surgical history, Social history, and Family history were reviewed and updated as appropriate.   Please see review of systems for further details on the patient's review from today.   Objective:   Physical Exam:  There were no vitals taken for this visit.  Physical Exam Constitutional:      General: She is not in acute distress.    Appearance: She is well-developed.  Musculoskeletal:        General: No deformity.  Neurological:     Mental Status: She is alert and oriented to person, place, and time.     Motor: No tremor.     Coordination: Coordination normal.     Gait: Gait normal.  Psychiatric:        Attention and Perception: Attention normal. She is attentive.        Mood and Affect: Mood normal. Mood is not anxious or depressed. Affect is not labile, blunt, angry or inappropriate.        Speech: Speech normal.        Behavior: Behavior normal.        Thought Content: Thought content normal. Thought content does not include homicidal or suicidal ideation. Thought content does not include homicidal or suicidal plan.        Cognition and Memory: Cognition normal.        Judgment: Judgment normal.     Comments: Insight is good.     Lab Review:  No results found for: NA, K, CL, CO2, GLUCOSE, BUN, CREATININE, CALCIUM, PROT, ALBUMIN, AST, ALT, ALKPHOS, BILITOT, GFRNONAA, GFRAA  No results found for: WBC, RBC, HGB, HCT, PLT, MCV, MCH, MCHC, RDW, LYMPHSABS, MONOABS, EOSABS, BASOSABS  No results found for: POCLITH, LITHIUM   No results found for: PHENYTOIN, PHENOBARB, VALPROATE, CBMZ   .res Assessment: Plan:    Generalized anxiety disorder  Major depression, recurrent, full remission (HCC)  Insomnia due to mental condition   Good response.  No indication  For changes.  Disc zolpidem amnesia risks.  She's aware.  FU 6 mos  Caroline Staggers,  MD, DFAPA   Please see After Visit Summary for patient specific instructions.  No future appointments.  No orders of the defined types were placed in this encounter.     -------------------------------

## 2018-04-02 ENCOUNTER — Other Ambulatory Visit: Payer: Self-pay | Admitting: Family Medicine

## 2018-04-02 DIAGNOSIS — I10 Essential (primary) hypertension: Secondary | ICD-10-CM

## 2018-04-06 DIAGNOSIS — F419 Anxiety disorder, unspecified: Secondary | ICD-10-CM | POA: Insufficient documentation

## 2018-04-06 DIAGNOSIS — R0789 Other chest pain: Secondary | ICD-10-CM | POA: Insufficient documentation

## 2018-04-06 DIAGNOSIS — N951 Menopausal and female climacteric states: Secondary | ICD-10-CM | POA: Insufficient documentation

## 2018-04-06 DIAGNOSIS — M545 Low back pain, unspecified: Secondary | ICD-10-CM | POA: Insufficient documentation

## 2018-04-06 DIAGNOSIS — B009 Herpesviral infection, unspecified: Secondary | ICD-10-CM | POA: Insufficient documentation

## 2018-04-07 ENCOUNTER — Ambulatory Visit (INDEPENDENT_AMBULATORY_CARE_PROVIDER_SITE_OTHER): Admitting: Internal Medicine

## 2018-04-07 ENCOUNTER — Encounter: Payer: Self-pay | Admitting: Internal Medicine

## 2018-04-07 VITALS — BP 172/92 | HR 74 | Ht 65.0 in | Wt 137.0 lb

## 2018-04-07 DIAGNOSIS — I10 Essential (primary) hypertension: Secondary | ICD-10-CM

## 2018-04-07 MED ORDER — CHLORTHALIDONE 25 MG PO TABS: 25.0000 mg | ORAL_TABLET | Freq: Every day | ORAL | 3 refills | Status: DC

## 2018-04-07 NOTE — Patient Instructions (Signed)
Medication Instructions:  Your physician has recommended you make the following change in your medication:   1) STOP HCTZ 2) START Chlorthalidone 25mg  daily. An Rx has been sent to your pharmacy  If you need a refill on your cardiac medications before your next appointment, please call your pharmacy.   Lab work: None ordered  Testing/Procedures: Your physician has requested that you have an echocardiogram. Echocardiography is a painless test that uses sound waves to create images of your heart. It provides your doctor with information about the size and shape of your heart and how well your heart's chambers and valves are working. This procedure takes approximately one hour. There are no restrictions for this procedure.    Follow-Up: At [facility], you and your health needs are our priority.  As part of our continuing mission to provide you with exceptional heart care, we have created designated Provider Care Teams.  These Care Teams include your primary Cardiologist (physician) and Advanced Practice Providers (APPs -  Physician Assistants and Nurse Practitioners) who all work together to provide you with the care you need, when you need it. You will need a follow up appointment in 3 months.   Rhonda Barrett, PA-C . Joni Reining, DNP, ANP  Any Other Special Instructions Will Be Listed Below (If Applicable). Your physician has requested that you regularly monitor and record your blood pressure readings at home. Please use the same machine at the same time of day to check your readings and record them every day for the for a week. Please my chart your readings.

## 2018-04-13 ENCOUNTER — Ambulatory Visit (HOSPITAL_COMMUNITY): Attending: Cardiology

## 2018-04-13 DIAGNOSIS — I10 Essential (primary) hypertension: Secondary | ICD-10-CM | POA: Diagnosis not present

## 2018-04-14 ENCOUNTER — Other Ambulatory Visit

## 2018-04-17 NOTE — Progress Notes (Signed)
Cardiology Office Note:    Date:  04/07/2018   ID:  Caroline Davis, Caroline Davis [birth year], MRN [id number]  PCP:  Catha Gosselin, MD  Cardiologist:  No primary care provider on file.  Electrophysiologist:  None   Referring MD: Catha Gosselin, MD   Hypertension  History of Present Illness:    Caroline Davis is a 57 y.o. female with a hx of depression and OCD, GERD, headaches, and HTN who presents today due to uncontrolled hypertension. 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 She often has her colleagues take her blood pressure with readings frequently >200/100 mmHg. She experienced eclampsia with pregnancy. She has had hypertension since her 69s.  She has previously taken beta blockers but experienced depression. She has lower extremity swelling with amlodipine. She felt hydralazine was not particularly effective. She currently takes clonidine 0.2 mg BID, HCTZ 25 mg daily, and losartan 100 mg daily.   She takes venlafaxine 75 mg a day for depression, and we discussed that this can contribute to hypertension however she states this a very necessary medication and would not like to transition if possible.   She denies a known history of coarctation of the aorta or renal artery stenosis. Renal artery ultrasound ordered by Dr. Clarene Duke. Denies frequent epistaxis.   The patient denies chest pain, chest pressure, dyspnea at rest or with exertion, palpitations, PND, orthopnea, or leg swelling. Denies syncope or presyncope. Denies dizziness or lightheadedness. Denies snoring.  Past Medical History:  Diagnosis Date  . Allergic rhinitis   . Depression   . Edema   . GERD (gastroesophageal reflux disease)   . Headache   . HTN (hypertension)   . Hypoglycemia   . IBS (irritable bowel syndrome)   . Insomnia   . Rectocele     Past Surgical History:  Procedure Laterality Date  . APPENDECTOMY    . CHOLECYSTECTOMY  2003  . FEMUR FRACTURE SURGERY  1994  .  hysterectomy, bso, endometriosis    . left breast needle biopsy  2016   benign findings    Current Medications: Current Meds  Medication Sig  . cloNIDine (CATAPRES) 0.2 MG tablet Take 0.2 mg by mouth 2 (two) times daily.  Lennox Solders (EUCRISA) 2 % OINT Apply topically 2 (two) times daily.  Marland Kitchen estradiol (ESTRACE) 0.5 MG tablet Take 0.5 mg by mouth daily.  Marland Kitchen losartan (COZAAR) 100 MG tablet Take 100 mg by mouth daily.  Marland Kitchen OVER THE COUNTER MEDICATION Take 2 capsules by mouth every morning. Mag L Theonate 2000  . venlafaxine (EFFEXOR) 75 MG tablet Take 1 tablet (75 mg total) by mouth 3 (three) times daily with meals.  Marland Kitchen zolpidem (AMBIEN) 10 MG tablet 1/2 to 1 tablet at night as needed for sleep  . [DISCONTINUED] hydrochlorothiazide (HYDRODIURIL) 25 MG tablet Take 25 mg by mouth daily.     Allergies:   Beta adrenergic blockers; Amlodipine; Aripiprazole; and Risperidone and related   Social History   Socioeconomic History  . Marital status: Single    Spouse name: Not on file  . Number of children: Not on file  . Years of education: Not on file  . Highest education level: Not on file  Occupational History  . Not on file  Social Needs  . Financial resource strain: Not on file  . Food insecurity:    Worry: Not on file    Inability: Not on file  . Transportation needs:    Medical:  Not on file    Non-medical: Not on file  Tobacco Use  . Smoking status: Never Smoker  . Smokeless tobacco: Never Used  Substance and Sexual Activity  . Alcohol use: No  . Drug use: No  . Sexual activity: Not on file  Lifestyle  . Physical activity:    Days per week: Not on file    Minutes per session: Not on file  . Stress: Not on file  Relationships  . Social connections:    Talks on phone: Not on file    Gets together: Not on file    Attends religious service: Not on file    Active member of club or organization: Not on file    Attends meetings of clubs or organizations: Not on file     Relationship status: Not on file  Other Topics Concern  . Not on file  Social History Narrative  . Not on file     Family History: The patient's family history includes Alzheimer's disease (age of onset: 1070) in her maternal grandmother; Bladder Cancer in her father; Colon cancer in her mother; Lung cancer in her maternal grandfather and paternal grandmother; Multiple sclerosis in her paternal grandfather; Prostate cancer (age of onset: 4757) in her brother.  ROS:   Please see the history of present illness.    All other systems reviewed and are negative.  EKGs/Labs/Other Studies Reviewed:    The following studies were reviewed today:  EKG:  NSR, rate 74 bpm  Epworth Sleepiness Scale: 0  Recent Labs: No results found for requested labs within last 8760 hours.  Recent Lipid Panel No results found for: CHOL, TRIG, HDL, CHOLHDL, VLDL, LDLCALC, LDLDIRECT  Physical Exam:    VS:  BP (!) 172/92   Pulse 74   Ht 5\' 5"  (1.651 m)   Wt 137 lb (62.1 kg)   BMI 22.80 kg/m     Wt Readings from Last 3 Encounters:  04/07/18 137 lb (62.1 kg)  05/02/16 130 lb (59 kg)  12/23/12 149 lb 1.9 oz (67.6 kg)     Constitutional: No acute distress Eyes:  normal conjunctiva and lids ENMT: normal dentition, moist mucous membranes Cardiovascular: regular rhythm, normal rate, no murmurs. S1 and S2 normal. Radial pulses normal bilaterally. No jugular venous distention. No S4. No bruits. Respiratory: clear to auscultation bilaterally GI : normal bowel sounds, soft and nontender. No distention.   MSK: extremities warm, well perfused. No edema.  NEURO: grossly nonfocal exam, moves all extremities. PSYCH: alert and oriented x 3, normal mood and affect.   ASSESSMENT:    1. Essential hypertension    PLAN:    HTN - we will transition from HCTZ to chlorthalidone 25 mg daily, and can add doxazosin if needed for additional control after she has recorded daily blood pressures. Continue losartan 100 mg  daily, and clonidine 0.2 mg BID. We will need close follow up to ensure adequate medication titration.   I would like to screen her with an echo for coarctation of the aorta, and assess LVH and diastolic function.   Can consider hydralazine as adjunct therapy as she had no adverse side effects with this medication.   She plans to travel for work soon, and will communicate with us by MyChart and phone to follow up closely.  Medication Adjustments/Labs and Tests Ordered: Current medicines are reviewed at length with the patient today.  Concerns regarding medicines are outlined above.  Orders Placed This Encounter  Procedures  . EKG 12-Lead  .  ECHOCARDIOGRAM COMPLETE   Meds ordered this encounter  Medications  . chlorthalidone (HYGROTON) 25 MG tablet    Sig: Take 1 tablet (25 mg total) by mouth daily.    Dispense:  90 tablet    Refill:  3    Patient Instructions  Medication Instructions:  Your physician has recommended you make the following change in your medication:   1) STOP HCTZ 2) START Chlorthalidone 25mg  daily. An Rx has been sent to your pharmacy  If you need a refill on your cardiac medications before your next appointment, please call your pharmacy.   Lab work: None ordered  Testing/Procedures: Your physician has requested that you have an echocardiogram. Echocardiography is a painless test that uses sound waves to create images of your heart. It provides your doctor with information about the size and shape of your heart and how well your heart's chambers and valves are working. This procedure takes approximately one hour. There are no restrictions for this procedure.    Follow-Up: At [facility], you and your health needs are our priority.  As part of our continuing mission to provide you with exceptional heart care, we have created designated Provider Care Teams.  These Care Teams include your primary Cardiologist (physician) and Advanced Practice Providers (APPs  -  Physician Assistants and Nurse Practitioners) who all work together to provide you with the care you need, when you need it. You will need a follow up appointment in 3 months.   Rhonda Barrett, PA-C . Joni Reining, DNP, ANP  Any Other Special Instructions Will Be Listed Below (If Applicable). Your physician has requested that you regularly monitor and record your blood pressure readings at home. Please use the same machine at the same time of day to check your readings and record them every day for the for a week. Please my chart your readings.        Signed, Parke Poisson, MD  04/07/2018 4:46 PM    [facility]

## 2018-05-13 ENCOUNTER — Other Ambulatory Visit: Payer: Self-pay | Admitting: Pharmacist

## 2018-05-13 ENCOUNTER — Telehealth: Payer: Self-pay | Admitting: Internal Medicine

## 2018-05-13 MED ORDER — IRBESARTAN 300 MG PO TABS: 300.0000 mg | ORAL_TABLET | Freq: Every day | ORAL | 1 refills | Status: DC

## 2018-05-13 NOTE — Telephone Encounter (Signed)
Message sent to patient using myChart.  Recommendation to change losartan to irbesartan 300mg  daily given. Will wait for patient response before sending new rx to pharmacy.

## 2018-05-13 NOTE — Telephone Encounter (Signed)
Returned call to patient's husband he was calling for his wife who is a traveling Charity fundraiser.She is worried about elevated B/P.She sent a email 05/01/18 with B/P readings.Advised I will send message to our pharmacy for advice.

## 2018-05-13 NOTE — Telephone Encounter (Signed)
° ° °  Patient's spouse calling to request advice from 3/6  MyChart message, regarding BP  Please call [phone]

## 2018-05-13 NOTE — Telephone Encounter (Signed)
Spoke to husband he will have wife send back a email with pharmacy to send in new prescription.

## 2018-05-22 ENCOUNTER — Telehealth: Payer: Self-pay | Admitting: Internal Medicine

## 2018-05-22 MED ORDER — HYDRALAZINE HCL 50 MG PO TABS: 50.0000 mg | ORAL_TABLET | Freq: Two times a day (BID) | ORAL | 2 refills | Status: DC

## 2018-05-22 NOTE — Telephone Encounter (Signed)
  Patient is calling c/o nausea and vomitting from the new medication and also that her blood pressure 250/110. Her vision gets a little blurry when her BP goes too high.

## 2018-05-22 NOTE — Telephone Encounter (Signed)
Spoke with the pt. Pt sts that she has been experiencing GI upset since being switched from Valsartan to Irbesartan. She has been taking the Irbesartan with food. She is taking her other anti-hypertensive medications as prescribed. 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 She has not been sleeping well due to her works schedule alternating night and day shift. She is also caring for her 83yr old father who is recovering from a recent carotidectomy.  She can tell when her BP is high, she usually experiences changes in her vision. She had her BP checked by her coworkers last night during her shift. 250/110. Her BP today at 1:30pm 239/106. Adv the pt that Dr.Acharya is out of the office this afternoon. I will talk with one of our PharmDs and call back with their recommendations. Pt agreeable with the plan.   Spoke with Caroline Davis, Pharm-D. Per Caroline Davis's instructions the pt should go ahead and take an additional clonidine 0.2mg  now. She is to start Hydralazine 50mg  bid. She should continue Irbesartan for 2-3 more days and the stop. She should continue daily monitoring of her BP. In 7-10 days if her by remains high she should plan to increase Hydralazine to 50mg  TID and Clonidine to 0.2mg  TID. She should contact the office to update Dr.Acharya and so her Rx can be updated.  Adv pt to also try to practice self care which is important in these stressful times. Pt agreeable with the plan and verbalized understanding to the instructions given. Pt voiced appreciation for the assistance.

## 2018-05-28 ENCOUNTER — Telehealth: Payer: Self-pay | Admitting: Internal Medicine

## 2018-05-28 NOTE — Telephone Encounter (Signed)
Left message for patient, she is at the same risk as we all are, she was encouraged to wear the correct protective equipment and to take precautions to protect herself.

## 2018-05-28 NOTE — Telephone Encounter (Signed)
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 They are saying Hypertensiion is at a higher risk for Covil19i. Please call to advise.

## 2018-05-29 NOTE — Telephone Encounter (Signed)
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 Will forward to dr Jacques Navy to review and advise.

## 2018-06-01 NOTE — Telephone Encounter (Signed)
Can we set her up with a video visit with me this week? There are several issues to address and I think a follow up would be the best way to answer her questions.  I will work around patient's schedule for visit - I know she is busy.

## 2018-06-02 ENCOUNTER — Telehealth: Payer: Self-pay | Admitting: *Deleted

## 2018-06-02 NOTE — Telephone Encounter (Signed)
Left message for patient to call and set up  Virtual visit with DR Jacques Navy

## 2018-06-02 NOTE — Telephone Encounter (Signed)
LEFT MESSAGE FOR PATIENT TO CALL BACK TO SET UP VIRTUAL VISIT WITH DR Jacques Navy-    CAN BE ANYDAY THAT DR Jacques Navy IS SCHEDULED WILL NEED SEND PATIENT A LINK - FOR CONSENT AND SET UP WEBEX.   PER DR ACHARYA - SHE WOULD LIKE TO DISCUSS BLOOD PRESSURE MEDICATIONS AND PATIENT REQUESTING A LETTER FOR OBTAINING PPE EQUIPMENT (SEE TELEPHONE NOTE 05/22/18 AND 05/28/18)

## 2018-06-10 ENCOUNTER — Other Ambulatory Visit: Payer: Self-pay | Admitting: Internal Medicine

## 2018-06-22 ENCOUNTER — Telehealth: Payer: Self-pay

## 2018-06-22 NOTE — Telephone Encounter (Signed)
LM2CB 5-11 @ 11AM

## 2018-06-24 ENCOUNTER — Telehealth: Payer: Self-pay | Admitting: Internal Medicine

## 2018-06-24 MED ORDER — IRBESARTAN 300 MG PO TABS: 300.0000 mg | ORAL_TABLET | Freq: Every day | ORAL | 0 refills | Status: DC

## 2018-06-24 MED ORDER — CHLORTHALIDONE 25 MG PO TABS: 25.0000 mg | ORAL_TABLET | Freq: Every day | ORAL | 0 refills | Status: DC

## 2018-06-24 MED ORDER — HYDRALAZINE HCL 50 MG PO TABS: 50.0000 mg | ORAL_TABLET | Freq: Two times a day (BID) | ORAL | 0 refills | Status: AC

## 2018-06-24 NOTE — Telephone Encounter (Signed)
Could one of you call the patient and see if she can do a virtual visit with me tomorrow or Friday? (4/30 or 5/1?) I would like to discuss her meds and BP with her before making further med recommendations, as noted in the telephone encounter from 06/01/2018 as well.   If she is able to, let me know what time and I will work with Selena Batten to get her set up since I am not in the office this week. Thank you!

## 2018-06-24 NOTE — Telephone Encounter (Signed)
RX sent to [facility] [place] per pt request and she plans to keep her follow up with Dr. Jacques Navy 07/06/18.

## 2018-06-24 NOTE — Telephone Encounter (Signed)
LM2CB-left detailed message that GA wants to schedule an appt

## 2018-06-24 NOTE — Telephone Encounter (Signed)
Called pt she states that she has been having trouble trying to get a hold of [place] for refills, I do not see any message of this. Informed pt of this. 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  She states that her BP is still running very high, she this this is partially d/t the stress at work but states that it has been running like this before she was in [place]. It has been running 200's/95-110's she states she has had to make due with the medications, she is taking as follows losartan 100mg  BID, clonidine 0.3 BID and chlorthalidone 25mg  daily. She will continue this way until appt until she is directed to do otherwise. Sent refill of Irbesartan "just in case" she feels more comfortable having this. Please review and send what you would like her to do next.

## 2018-06-24 NOTE — Telephone Encounter (Signed)
New Message    Pt says she can wait until her next appt as long as she has the refill    Please call

## 2018-06-29 NOTE — Telephone Encounter (Signed)
LM2CB needs sooner appt per Dr Jacques Navy

## 2018-07-03 ENCOUNTER — Telehealth: Payer: Self-pay

## 2018-07-03 NOTE — Telephone Encounter (Signed)
Called patient to discuss upcoming appointment with Dr Rudene Anda on 07/06/18.  lmtcb

## 2018-07-06 ENCOUNTER — Telehealth (INDEPENDENT_AMBULATORY_CARE_PROVIDER_SITE_OTHER): Admitting: Internal Medicine

## 2018-07-06 ENCOUNTER — Encounter: Payer: Self-pay | Admitting: Internal Medicine

## 2018-07-06 VITALS — BP 150/90 | Ht 64.0 in | Wt 141.0 lb

## 2018-07-06 DIAGNOSIS — I10 Essential (primary) hypertension: Secondary | ICD-10-CM

## 2018-07-06 DIAGNOSIS — F329 Major depressive disorder, single episode, unspecified: Secondary | ICD-10-CM

## 2018-07-06 DIAGNOSIS — F32A Depression, unspecified: Secondary | ICD-10-CM

## 2018-07-06 NOTE — Patient Instructions (Signed)
Medication Instructions:  The current medical regimen is effective;  continue present plan and medications.  If you need a refill on your cardiac medications before your next appointment, please call your pharmacy.   Follow-Up: At [facility], you and your health needs are our priority.  As part of our continuing mission to provide you with exceptional heart care, we have created designated Provider Care Teams.  These Care Teams include your primary Cardiologist (physician) and Advanced Practice Providers (APPs -  Physician Assistants and Nurse Practitioners) who all work together to provide you with the care you need, when you need it. . Follow up with Pharmacist for hypertension in 1 month. . Follow up with Dr.Acharya in 3 months.

## 2018-07-25 NOTE — Progress Notes (Signed)
Virtual Visit via Video Note   This visit type was conducted due to national recommendations for restrictions regarding the COVID-19 Pandemic (e.g. social distancing) in an effort to limit this patient's exposure and mitigate transmission in our community.  Due to her co-morbid illnesses, this patient is at least at moderate risk for complications without adequate follow up.  This format is felt to be most appropriate for this patient at this time.  All issues noted in this document were discussed and addressed.  A limited physical exam was performed with this format.  Please refer to the patient's chart for her consent to telehealth for [facility].   Date:  07/06/2018   ID:  Caroline Davis, DOB [birth year], MRN [id number]  Patient Location: Home Provider Location: Office  PCP:  Catha Davis, Kevin, MD  Cardiologist:  Caroline PoissonGayatri A Aharon Carriere, MD  Electrophysiologist:  None   Evaluation Performed:  Follow-Up Visit  Chief Complaint:  Severe HTN  History of Present Illness:    Caroline Davis is a 58 y.o. female with depression and OCD, GERD, headaches, and HTN. She presents today for follow up.   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 She has been taking appropriate precautions. However she has not be able to take her antihypertensives as scheduled partially due to work schedule. She has recently reinitiated her full regimen, and does feel better.  Prior to that blood pressures have been quite elevated. Due to COVID-19 and her work schedule, she has not been able to complete her renal evaluation in the setting of severe hypertension.  She plans to do this upon her return soon.  She recalls having a previously normal work-up remotely.While she was away in March she contacted our office to let us know that she was having continued significantly elevated blood pressures.  My colleague pharmacist Dr. Penni Homansodriguez Guzman transition her from losartan to irbesartan.  She  initially had some GI upset with this medication but is tolerating it well now.  Her blood pressure today while still elevated is significantly improved from prior readings on therapy.  We spoke today about her medical history qualifying her as a high risk person during the COVID-19 pandemic.  While she is aware that we are not able to mandate the use of N95 masks due to issues of supply at her local facilities, we would be happy to document her medical history in a physician's note if necessary stating her risk.  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  The patient denies chest pain, chest pressure, dyspnea at rest or with exertion, palpitations, PND, orthopnea, or leg swelling. Denies syncope or presyncope. Denies dizziness or lightheadedness.  The patient does not have symptoms concerning for COVID-19 infection (fever, chills, cough, or new shortness of breath).    Past Medical History:  Diagnosis Date  . Allergic rhinitis   . Depression   . Edema   . GERD (gastroesophageal reflux disease)   . Headache   . HTN (hypertension)   . Hypoglycemia   . IBS (irritable bowel syndrome)   . Insomnia   . Rectocele    Past Surgical History:  Procedure Laterality Date  . APPENDECTOMY    . CHOLECYSTECTOMY  2003  . FEMUR FRACTURE SURGERY  1994  . hysterectomy, bso, endometriosis    . left breast needle biopsy  2016   benign findings     Current Meds  Medication Sig  . chlorthalidone (HYGROTON) 25 MG tablet Take 1 tablet (25 mg total) by mouth daily.  . cloNIDine (CATAPRLennox Solders tablet Take 0.2 mg by mouth 2 (two) times daily.  . Crisaborole (EUCRISA) 2 % OINT Apply topically 2 (two) times daily.  Marland Kitchen estradiol (ESTRACE) 0.5 MG tablet Take 0.5 mg by mouth daily.  . hydrALAZINE (APRESOLINE) 50 MG tablet Take 1 tablet (50 mg total) by mouth 2 (two) times daily. (Patient taking differently: Take 50 mg by mouth daily. )  . irbesartan  (AVAPRO) 300 MG tablet Take 1 tablet (300 mg total) by mouth daily. TO REPLACE LOSARTAN  . OVER THE COUNTER MEDICATION Take 2 capsules by mouth every morning. Mag L Theonate 2000  . venlafaxine (EFFEXOR) 75 MG tablet Take 1 tablet (75 mg total) by mouth 3 (three) times daily with meals.  Marland Kitchen zolpidem (AMBIEN) 10 MG tablet 1/2 to 1 tablet at night as needed for sleep     Allergies:   Beta adrenergic blockers; Amlodipine; Aripiprazole; and Risperidone and related   Social History   Tobacco Use  . Smoking status: Never Smoker  . Smokeless tobacco: Never Used  Substance Use Topics  . Alcohol use: No  . Drug use: No     Family Hx: The patient's family history includes Alzheimer's disease (age of onset: 26) in her maternal grandmother; Bladder Cancer in her father; Colon cancer in her mother; Lung cancer in her maternal grandfather and paternal grandmother; Multiple sclerosis in her paternal grandfather; Prostate cancer (age of onset: 49) in her brother.  ROS:   Please see the history of present illness.    All other systems reviewed and are negative.   Prior CV studies:   The following studies were reviewed today:    Labs/Other Tests and Data Reviewed:    EKG:  No ECG reviewed.  Recent Labs: No results found for requested labs within last 8760 hours.   Recent Lipid Panel No results found for: CHOL, TRIG, HDL, CHOLHDL, LDLCALC, LDLDIRECT  Wt Readings from Last 3 Encounters:  07/06/18 141 lb (64 kg)  04/07/18 137 lb (62.1 kg)  05/02/16 130 lb (59 kg)     Objective:    Vital Signs:  BP (!) 150/90   Ht  (1.626 m)   Wt 141 lb (64 kg)   BMI 24.20 kg/m    VITAL SIGNS:  reviewed GEN:  no acute distress EYES:  sclerae anicteric, EOMI - Extraocular Movements Intact RESPIRATORY:  normal respiratory effort, symmetric expansion CARDIOVASCULAR:  no peripheral edema SKIN:  no rash, lesions or ulcers. MUSCULOSKELETAL:  no obvious deformities. NEURO:  alert and oriented x  3, no obvious focal deficit PSYCH:  normal affect  ASSESSMENT & PLAN:    1. Essential hypertension   2. Depression, unspecified depression type    Hypertension- her hypertension is felt to be essential hypertension given its longstanding nature and previous remote investigation for secondary causes, however it is prudent for her to complete an evaluation from a renal perspective to ensure there are no new concerns.  We have obtained an echocardiogram which was largely within the normal limits no evidence of coarctation on echo.  Given that she is recently reinitiated her full antihypertensive regimen and has seen a good response with improvement in blood pressure, we will make no changes to therapy today, however I would like her to follow-up with our colleagues in pharmacy in approximately 1 month to ensure that she has the opportunity for  adequate titration of medications.  Consideration for addition of other agents may be necessary, with an eye towards her previous history of mood disorder affected by some antihypertensive agents.  I will see the patient back in 3 months to continue her medication titration and follow-up.  Cardiovascular screening-if she has not recently had a lipid panel, we should perform this at our next visit to screen for hyperlipidemia to fully investigate her risk factors.  COVID-19 Education: The signs and symptoms of COVID-19 were discussed with the patient and how to seek care for testing (follow up with PCP or arrange E-visit).  The importance of social distancing was discussed today.  Time:   Today, I have spent 25 minutes with the patient with telehealth technology discussing the above problems.   Medication Adjustments/Labs and Tests Ordered: Current medicines are reviewed at length with the patient today.  Concerns regarding medicines are outlined above.   Tests Ordered: No orders of the defined types were placed in this encounter.   Medication Changes:  No orders of the defined types were placed in this encounter.   Disposition:  Follow up in 3 month(s)  Signed, Caroline Poisson, MD  07/06/2018 12:57 PM    [facility]

## 2018-07-31 ENCOUNTER — Telehealth: Payer: Self-pay | Admitting: Internal Medicine

## 2018-07-31 MED ORDER — IRBESARTAN 300 MG PO TABS: 300.0000 mg | ORAL_TABLET | Freq: Every day | ORAL | 0 refills | Status: DC

## 2018-07-31 NOTE — Telephone Encounter (Signed)
Returned call to patient who reports LAST WEEK for 3 nights in a row her BP was low and she felt faint stating she felt "drunk" 30-63mins after taking hydralazine. She reports BP during this time was 90/40 about 1 hour after her medications. She is taking all medications as prescribed except hydralazine 50mg  ONCE DAILY before bed - she felt "lifeless" during the day when she was on this BID. She reports during the day, her BP is 120-130s/80s. She would ike advice on what to do since BP is low after PM hydralazine and she feels faint.   OK to send message via MyChart with recommendations

## 2018-07-31 NOTE — Telephone Encounter (Signed)
Pt c/o medication issue:  1. Name of Medication: hydrALAZINE (APRESOLINE) 50 MG tablet  2. How are you currently taking this medication (dosage and times per day)?  50 mg once daily   3. Are you having a reaction (difficulty breathing--STAT)? no  4. What is your medication issue? Pt reports that her BP goes low and she feels like fainting the past two nights.  She is wondering if she is taking too much medication

## 2018-08-01 NOTE — Telephone Encounter (Signed)
Spoke to patient at 7:12PM yesterday for 8 minutes. We discussed decreasing hydralazine to 25 mg once daily and she suggested she will try this at 4pm and stagger nightly BP needs. No other changes to medications at this time. She will call back in one week with BP readings and symptoms. Discussed red flag symptoms of large swings in BP and discussed indications for presentation to the ED. She agreed to look out for red flag symptoms. < BR>Advised to call with any other symptoms or concerns.  GA

## 2018-09-29 ENCOUNTER — Ambulatory Visit: Admitting: Psychiatry

## 2018-10-20 ENCOUNTER — Ambulatory Visit (INDEPENDENT_AMBULATORY_CARE_PROVIDER_SITE_OTHER): Admitting: Psychiatry

## 2018-10-20 ENCOUNTER — Other Ambulatory Visit: Payer: Self-pay

## 2018-10-20 ENCOUNTER — Encounter: Payer: Self-pay | Admitting: Psychiatry

## 2018-10-20 DIAGNOSIS — F3342 Major depressive disorder, recurrent, in full remission: Secondary | ICD-10-CM | POA: Diagnosis not present

## 2018-10-20 DIAGNOSIS — F411 Generalized anxiety disorder: Secondary | ICD-10-CM

## 2018-10-20 DIAGNOSIS — F5105 Insomnia due to other mental disorder: Secondary | ICD-10-CM

## 2018-10-20 MED ORDER — ZOLPIDEM TARTRATE 10 MG PO TABS: ORAL_TABLET | ORAL | 1 refills | Status: AC

## 2018-10-20 NOTE — Progress Notes (Signed)
Caroline MusaMelane H Majeed [id number] Jul 13, 1960 58 y.o.  Subjective:   Patient ID:  Caroline Davis is a 58 y.o. (DOB Jul 13, 1960) female.  Chief Complaint:  Chief Complaint  Patient presents with  . Follow-up     Medication Management  . Anxiety     Medication Management  . Sleeping Problem    Anxiety Patient reports no chest pain, confusion, decreased concentration, dizziness, nervous/anxious behavior or suicidal ideas.     Caroline Davis presents to the office today for follow-up of depression anxiety and chronic insomnia.  Last seen April 20, 2018.  No meds were changed.  Doing well overall.  Still camping.  Mood and anxiety controlled.    [place] Patient reports stable mood and denies depressed or irritable moods.  Patient denies any recent difficulty with anxiety.  Patient denies difficulty with sleep initiation or maintenance but requires Ambien. 7 hours.  Denies appetite disturbance.  Patient reports that energy and motivation have been good.  Patient denies any difficulty with concentration.  Patient denies any suicidal ideation.  Still uncontrolled hypertension resistant to therapy.  Chronic issues.  Past psychiatric med trials and sleep med trials include mirtazapine which cause restless legs, alprazolam, zolpidem, olanzapine, Abilify, risperidone, Effexor, sertraline, fluvoxamine, pindolol, lithium  Review of Systems:  Review of Systems  Cardiovascular: Negative for chest pain.  Neurological: Positive for headaches. Negative for dizziness, tremors and weakness.  Psychiatric/Behavioral: Negative for agitation, behavioral problems, confusion, decreased concentration, dysphoric mood, hallucinations, self-injury, sleep disturbance and suicidal ideas. The patient is not nervous/anxious and is not hyperactive.     Medications: I have reviewed the patient's current medications.  Current Outpatient Medications   Medication Sig Dispense Refill  . chlorthalidone (HYGROTON) 25 MG tablet Take 1 tablet (25 mg total) by mouth daily. 90 tablet 0  . cloNIDine (CATAPRES) 0.2 MG tablet Take 0.2 mg by mouth 2 (two) times daily.    Lennox Solders. Crisaborole (EUCRISA) 2 % OINT Apply topically 2 (two) times daily.    Marland Kitchen. estradiol (ESTRACE) 0.5 MG tablet Take 0.5 mg by mouth daily.    . hydrALAZINE (APRESOLINE) 50 MG tablet Take 1 tablet (50 mg total) by mouth 2 (two) times daily. (Patient taking differently: Take 50 mg by mouth daily. ) 180 tablet 0  . irbesartan (AVAPRO) 300 MG tablet Take 1 tablet (300 mg total) by mouth daily. 90 tablet 0  . Melatonin 5 MG CAPS Take by mouth.    Marland Kitchen. OVER THE COUNTER MEDICATION Take 2 capsules by mouth every morning. Mag L Theonate 2000    . venlafaxine (EFFEXOR) 75 MG tablet Take 1 tablet (75 mg total) by mouth 3 (three) times daily with meals. 270 tablet 3  . zolpidem (AMBIEN) 10 MG tablet 1/2 to 1 tablet at night as needed for sleep 90 tablet 1   No current facility-administered medications for this visit.     Medication Side Effects: None  Allergies:  Allergies  Allergen Reactions  . Beta Adrenergic Blockers     DEPRESSION  . Amlodipine     EDEMA   . Aripiprazole     woozy  . Risperidone And Related     suicidal    Past Medical History:  Diagnosis Date  . Allergic rhinitis   . Depression   . Edema   . GERD (gastroesophageal reflux disease)   . Headache   . HTN (hypertension)   . Hypoglycemia   . IBS (irritable  bowel syndrome)   . Insomnia   . Rectocele     Family History  Problem Relation Age of Onset  . Colon cancer Mother   . Bladder Cancer Father   . Prostate cancer Brother 19  . Alzheimer's disease Maternal Grandmother 30  . Lung cancer Maternal Grandfather   . Lung cancer Paternal Grandmother   . Multiple sclerosis Paternal Grandfather     Social History   Socioeconomic History  . Marital status: Single    Spouse name: Not on file  . Number of  children: Not on file  . Years of education: Not on file  . Highest education level: Not on file  Occupational History  . Not on file  Social Needs  . Financial resource strain: Not on file  . Food insecurity    Worry: Not on file    Inability: Not on file  . Transportation needs    Medical: Not on file    Non-medical: Not on file  Tobacco Use  . Smoking status: Never Smoker  . Smokeless tobacco: Never Used  Substance and Sexual Activity  . Alcohol use: No  . Drug use: No  . Sexual activity: Not on file  Lifestyle  . Physical activity    Days per week: Not on file    Minutes per session: Not on file  . Stress: Not on file  Relationships  . Social Herbalist on phone: Not on file    Gets together: Not on file    Attends religious service: Not on file    Active member of club or organization: Not on file    Attends meetings of clubs or organizations: Not on file    Relationship status: Not on file  . Intimate partner violence    Fear of current or ex partner: Not on file    Emotionally abused: Not on file    Physically abused: Not on file    Forced sexual activity: Not on file  Other Topics Concern  . Not on file  Social History Narrative  . Not on file    Past Medical History, Surgical history, Social history, and Family history were reviewed and updated as appropriate.   Please see review of systems for further details on the patient's review from today.   Objective:   Physical Exam:  There were no vitals taken for this visit.  Physical Exam Constitutional:      General: She is not in acute distress.    Appearance: She is well-developed.  Musculoskeletal:        General: No deformity.  Neurological:     Mental Status: She is alert and oriented to person, place, and time.     Motor: No tremor.     Coordination: Coordination normal.     Gait: Gait normal.  Psychiatric:        Attention and Perception: Attention normal. She is attentive.         Mood and Affect: Mood normal. Mood is not anxious or depressed. Affect is not labile, blunt, angry or inappropriate.        Speech: Speech normal.        Behavior: Behavior normal.        Thought Content: Thought content normal. Thought content does not include homicidal or suicidal ideation. Thought content does not include homicidal or suicidal plan.        Cognition and Memory: Cognition normal.        Judgment:  Judgment normal.     Comments: Insight is good.     Lab Review:  No results found for: NA, K, CL, CO2, GLUCOSE, BUN, CREATININE, CALCIUM, PROT, ALBUMIN, AST, ALT, ALKPHOS, BILITOT, GFRNONAA, GFRAA  No results found for: WBC, RBC, HGB, HCT, PLT, MCV, MCH, MCHC, RDW, LYMPHSABS, MONOABS, EOSABS, BASOSABS  No results found for: POCLITH, LITHIUM   No results found for: PHENYTOIN, PHENOBARB, VALPROATE, CBMZ   .res Assessment: Plan:    Insomnia due to mental condition - Plan: zolpidem (AMBIEN) 10 MG tablet  Generalized anxiety disorder  Major depression, recurrent, full remission (HCC)   Good response.  No indication  For changes.   Disc SE in detail and SSRI withdrawal sx.  Disc zolpidem amnesia risks.  She's aware.  FU 6 mos  Meredith Staggers, MD, DFAPA   Please see After Visit Summary for patient specific instructions.  No future appointments.  No orders of the defined types were placed in this encounter.     -------------------------------

## 2018-12-31 ENCOUNTER — Ambulatory Visit
Admission: RE | Admit: 2018-12-31 | Discharge: 2018-12-31 | Disposition: A | Discharge status: Home / Self Care | Source: Ambulatory Visit | Attending: Family Medicine | Admitting: Family Medicine

## 2018-12-31 DIAGNOSIS — I10 Essential (primary) hypertension: Secondary | ICD-10-CM

## 2019-01-13 ENCOUNTER — Ambulatory Visit: Admitting: Cardiology

## 2019-01-13 NOTE — Progress Notes (Deleted)
Cardiology Office Note:    Date:  01/13/2019   ID:  Ed, Rayson [birth year], MRN [id number]  PCP:  Catha Gosselin, MD  Cardiologist:  Norman Herrlich, MD    Referring MD: Catha Gosselin, MD    ASSESSMENT:    No diagnosis found. PLAN:    In order of problems listed above:  1. ***   Next appointment: ***   Medication Adjustments/Labs and Tests Ordered: Current medicines are reviewed at length with the patient today.  Concerns regarding medicines are outlined above.  No orders of the defined types were placed in this encounter.  No orders of the defined types were placed in this encounter.   No chief complaint on file.   History of Present Illness:    Caroline Davis is a 58 y.o. female with a hx of difficult to control HTN seeking an other opinion last seen 07/06/2018 by Dr Hettie Holstein . Compliance with diet, lifestyle and medications: ***  Renal arterial duplex 12/31/2018: IMPRESSION: 1. No evidence for renal artery stenosis. 2. There are 2 renal arteries bilaterally. 3. Normal sonographic appearance of the kidneys.  Echo 04/13/2018: 1. The left ventricle has normal systolic function with an ejection fraction of 60-65%. The cavity size was normal. Left ventricular diastolic parameters were normal No evidence of left ventricular regional wall motion abnormalities.  2. The right ventricle has normal systolic function. The cavity was normal. There is no increase in right ventricular wall thickness. Right ventricular systolic pressure could not be assessed.  3. The mitral valve is normal in structure.  4. The tricuspid valve is normal in structure.  5. The aortic valve is tricuspid.  6. The aortic root and ascending aorta are normal in size and structure.  7. No evidence of left ventricular regional wall motion abnormalities.  8. Right atrial pressure is estimated at 3 mmHg.  03/27/2018 labs from her primary care physician [facility]: CBC normal  CMP normal GFR 79 cc potassium 4.5 TSH normal LDL 81 Past Medical History:  Diagnosis Date  . Allergic rhinitis   . Depression   . Edema   . GERD (gastroesophageal reflux disease)   . Headache   . HTN (hypertension)   . Hypoglycemia   . IBS (irritable bowel syndrome)   . Insomnia   . Rectocele     Past Surgical History:  Procedure Laterality Date  . APPENDECTOMY    . CHOLECYSTECTOMY  2003  . FEMUR FRACTURE SURGERY  1994  . hysterectomy, bso, endometriosis    . left breast needle biopsy  2016   benign findings    Current Medications: No outpatient medications have been marked as taking for the 01/13/19 encounter (Appointment) with Baldo Daub, MD.     Allergies:   Beta adrenergic blockers, Amlodipine, Aripiprazole, and Risperidone and related   Social History   Socioeconomic History  . Marital status: Single    Spouse name: Not on file  . Number of children: Not on file  . Years of education: Not on file  . Highest education level: Not on file  Occupational History  . Not on file  Social Needs  . Financial resource strain: Not on file  . Food insecurity    Worry: Not on file    Inability: Not on file  . Transportation needs    Medical: Not on file    Non-medical: Not on file  Tobacco Use  . Smoking status: Never Smoker  . Smokeless tobacco: Never Used  Substance and Sexual Activity  . Alcohol use: No  . Drug use: No  . Sexual activity: Not on file  Lifestyle  . Physical activity    Days per week: Not on file    Minutes per session: Not on file  . Stress: Not on file  Relationships  . Social Herbalist on phone: Not on file    Gets together: Not on file    Attends religious service: Not on file    Active member of club or organization: Not on file    Attends meetings of clubs or organizations: Not on file    Relationship status: Not on file  Other Topics Concern  . Not on file  Social History Narrative  . Not on file     Family  History: The patient's ***family history includes Alzheimer's disease (age of onset: 82) in her maternal grandmother; Bladder Cancer in her father; Colon cancer in her mother; Lung cancer in her maternal grandfather and paternal grandmother; Multiple sclerosis in her paternal grandfather; Prostate cancer (age of onset: 6) in her brother. ROS:   Please see the history of present illness.    All other systems reviewed and are negative.  EKGs/Labs/Other Studies Reviewed:    The following studies were reviewed today:  EKG:  EKG ordered today and personally reviewed.  The ekg ordered today demonstrates ***  Recent Labs: No results found for requested labs within last 8760 hours.  Recent Lipid Panel No results found for: CHOL, TRIG, HDL, CHOLHDL, VLDL, LDLCALC, LDLDIRECT  Physical Exam:    VS:  There were no vitals taken for this visit.    Wt Readings from Last 3 Encounters:  07/06/18 141 lb (64 kg)  04/07/18 137 lb (62.1 kg)  05/02/16 130 lb (59 kg)     GEN: *** Well nourished, well developed in no acute distress HEENT: Normal NECK: No JVD; No carotid bruits LYMPHATICS: No lymphadenopathy CARDIAC: ***RRR, no murmurs, rubs, gallops RESPIRATORY:  Clear to auscultation without rales, wheezing or rhonchi  ABDOMEN: Soft, non-tender, non-distended MUSCULOSKELETAL:  No edema; No deformity  SKIN: Warm and dry NEUROLOGIC:  Alert and oriented x 3 PSYCHIATRIC:  Normal affect    Signed, Shirlee More, MD  01/13/2019 10:23 AM    [place]

## 2019-01-17 ENCOUNTER — Other Ambulatory Visit: Payer: Self-pay

## 2019-01-17 ENCOUNTER — Encounter (HOSPITAL_COMMUNITY): Payer: Self-pay

## 2019-01-17 ENCOUNTER — Inpatient Hospital Stay (HOSPITAL_COMMUNITY)
Admission: EM | Admit: 2019-01-17 | Discharge: 2019-01-22 | DRG: 177 | Disposition: A | Discharge status: Home / Self Care | Attending: Internal Medicine | Admitting: Internal Medicine

## 2019-01-17 DIAGNOSIS — Z9049 Acquired absence of other specified parts of digestive tract: Secondary | ICD-10-CM

## 2019-01-17 DIAGNOSIS — F329 Major depressive disorder, single episode, unspecified: Secondary | ICD-10-CM | POA: Diagnosis not present

## 2019-01-17 DIAGNOSIS — Z888 Allergy status to other drugs, medicaments and biological substances status: Secondary | ICD-10-CM | POA: Diagnosis not present

## 2019-01-17 DIAGNOSIS — E860 Dehydration: Secondary | ICD-10-CM | POA: Diagnosis present

## 2019-01-17 DIAGNOSIS — Z9079 Acquired absence of other genital organ(s): Secondary | ICD-10-CM

## 2019-01-17 DIAGNOSIS — I10 Essential (primary) hypertension: Secondary | ICD-10-CM | POA: Diagnosis present

## 2019-01-17 DIAGNOSIS — K219 Gastro-esophageal reflux disease without esophagitis: Secondary | ICD-10-CM | POA: Diagnosis present

## 2019-01-17 DIAGNOSIS — Z90722 Acquired absence of ovaries, bilateral: Secondary | ICD-10-CM

## 2019-01-17 DIAGNOSIS — Z90710 Acquired absence of both cervix and uterus: Secondary | ICD-10-CM | POA: Diagnosis not present

## 2019-01-17 DIAGNOSIS — Z79899 Other long term (current) drug therapy: Secondary | ICD-10-CM | POA: Diagnosis not present

## 2019-01-17 DIAGNOSIS — J9601 Acute respiratory failure with hypoxia: Secondary | ICD-10-CM | POA: Diagnosis not present

## 2019-01-17 DIAGNOSIS — A084 Viral intestinal infection, unspecified: Secondary | ICD-10-CM | POA: Diagnosis present

## 2019-01-17 DIAGNOSIS — J1289 Other viral pneumonia: Secondary | ICD-10-CM | POA: Diagnosis present

## 2019-01-17 DIAGNOSIS — G4700 Insomnia, unspecified: Secondary | ICD-10-CM | POA: Diagnosis present

## 2019-01-17 DIAGNOSIS — Z79890 Hormone replacement therapy (postmenopausal): Secondary | ICD-10-CM | POA: Diagnosis not present

## 2019-01-17 DIAGNOSIS — E876 Hypokalemia: Secondary | ICD-10-CM | POA: Diagnosis present

## 2019-01-17 DIAGNOSIS — K589 Irritable bowel syndrome without diarrhea: Secondary | ICD-10-CM | POA: Diagnosis not present

## 2019-01-17 DIAGNOSIS — R0602 Shortness of breath: Secondary | ICD-10-CM | POA: Diagnosis present

## 2019-01-17 DIAGNOSIS — R1110 Vomiting, unspecified: Secondary | ICD-10-CM

## 2019-01-17 DIAGNOSIS — U071 COVID-19: Principal | ICD-10-CM

## 2019-01-17 DIAGNOSIS — J1282 Pneumonia due to coronavirus disease 2019: Secondary | ICD-10-CM | POA: Diagnosis present

## 2019-01-17 MED ORDER — SODIUM CHLORIDE 0.9% FLUSH: 3.0000 mL | Freq: Once | INTRAVENOUS | Status: DC

## 2019-01-17 NOTE — ED Triage Notes (Signed)
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 She is complaining of emesis, headache, body aches, fatigue, SOB, and chest pain.

## 2019-01-18 ENCOUNTER — Emergency Department (HOSPITAL_COMMUNITY)

## 2019-01-18 DIAGNOSIS — R1110 Vomiting, unspecified: Secondary | ICD-10-CM | POA: Diagnosis not present

## 2019-01-18 DIAGNOSIS — G4700 Insomnia, unspecified: Secondary | ICD-10-CM | POA: Diagnosis present

## 2019-01-18 DIAGNOSIS — I10 Essential (primary) hypertension: Secondary | ICD-10-CM | POA: Diagnosis present

## 2019-01-18 DIAGNOSIS — J9601 Acute respiratory failure with hypoxia: Secondary | ICD-10-CM

## 2019-01-18 DIAGNOSIS — E860 Dehydration: Secondary | ICD-10-CM | POA: Diagnosis present

## 2019-01-18 DIAGNOSIS — Z9049 Acquired absence of other specified parts of digestive tract: Secondary | ICD-10-CM | POA: Diagnosis not present

## 2019-01-18 DIAGNOSIS — K589 Irritable bowel syndrome without diarrhea: Secondary | ICD-10-CM | POA: Diagnosis present

## 2019-01-18 DIAGNOSIS — Z9079 Acquired absence of other genital organ(s): Secondary | ICD-10-CM | POA: Diagnosis not present

## 2019-01-18 DIAGNOSIS — Z888 Allergy status to other drugs, medicaments and biological substances status: Secondary | ICD-10-CM | POA: Diagnosis not present

## 2019-01-18 DIAGNOSIS — Z90710 Acquired absence of both cervix and uterus: Secondary | ICD-10-CM | POA: Diagnosis not present

## 2019-01-18 DIAGNOSIS — Z90722 Acquired absence of ovaries, bilateral: Secondary | ICD-10-CM | POA: Diagnosis not present

## 2019-01-18 DIAGNOSIS — J1282 Pneumonia due to coronavirus disease 2019: Secondary | ICD-10-CM | POA: Diagnosis present

## 2019-01-18 DIAGNOSIS — R0602 Shortness of breath: Secondary | ICD-10-CM | POA: Diagnosis present

## 2019-01-18 DIAGNOSIS — Z79899 Other long term (current) drug therapy: Secondary | ICD-10-CM | POA: Diagnosis not present

## 2019-01-18 DIAGNOSIS — U071 COVID-19: Principal | ICD-10-CM

## 2019-01-18 DIAGNOSIS — E876 Hypokalemia: Secondary | ICD-10-CM | POA: Diagnosis present

## 2019-01-18 DIAGNOSIS — A084 Viral intestinal infection, unspecified: Secondary | ICD-10-CM | POA: Diagnosis present

## 2019-01-18 DIAGNOSIS — Z79890 Hormone replacement therapy (postmenopausal): Secondary | ICD-10-CM | POA: Diagnosis not present

## 2019-01-18 DIAGNOSIS — F329 Major depressive disorder, single episode, unspecified: Secondary | ICD-10-CM | POA: Diagnosis present

## 2019-01-18 DIAGNOSIS — K219 Gastro-esophageal reflux disease without esophagitis: Secondary | ICD-10-CM | POA: Diagnosis present

## 2019-01-18 DIAGNOSIS — J1289 Other viral pneumonia: Secondary | ICD-10-CM

## 2019-01-18 LAB — MAGNESIUM: Magnesium: 1.6 mg/dL — ABNORMAL LOW (ref 1.7–2.4)

## 2019-01-18 LAB — POC SARS CORONAVIRUS 2 AG -  ED: SARS Coronavirus 2 Ag: POSITIVE — AB

## 2019-01-18 LAB — LACTATE DEHYDROGENASE: LDH: 182 U/L (ref 98–192)

## 2019-01-18 LAB — BASIC METABOLIC PANEL
Anion gap: 13 (ref 5–15)
BUN: 21 mg/dL — ABNORMAL HIGH (ref 6–20)
CO2: 24 mmol/L (ref 22–32)
Calcium: 9 mg/dL (ref 8.9–10.3)
Chloride: 103 mmol/L (ref 98–111)
Creatinine, Ser: 0.79 mg/dL (ref 0.44–1.00)
GFR calc Af Amer: >60 mL/min (ref >60)
GFR calc non Af Amer: >60 mL/min (ref >60)
Glucose, Bld: 123 mg/dL — ABNORMAL HIGH (ref 70–99)
Potassium: 3 mmol/L — ABNORMAL LOW (ref 3.5–5.1)
Sodium: 140 mmol/L (ref 135–145)

## 2019-01-18 LAB — TROPONIN I (HIGH SENSITIVITY)
Troponin I (High Sensitivity): 2 ng/L (ref <18)
Troponin I (High Sensitivity): 2 ng/L (ref <18)

## 2019-01-18 LAB — CBC
HCT: 36.8 % (ref 36.0–46.0)
Hemoglobin: 12.5 g/dL (ref 12.0–15.0)
MCH: 31.1 pg (ref 26.0–34.0)
MCHC: 34 g/dL (ref 30.0–36.0)
MCV: 91.5 fL (ref 80.0–100.0)
Platelets: 283 10*3/uL (ref 150–400)
RBC: 4.02 MIL/uL (ref 3.87–5.11)
RDW: 11.7 % (ref 11.5–15.5)
WBC: 5.3 10*3/uL (ref 4.0–10.5)
nRBC: 0 % (ref 0.0–0.2)

## 2019-01-18 LAB — HEPATIC FUNCTION PANEL
ALT: 31 U/L (ref 0–44)
AST: 33 U/L (ref 15–41)
Albumin: 3.4 g/dL — ABNORMAL LOW (ref 3.5–5.0)
Alkaline Phosphatase: 114 U/L (ref 38–126)
Bilirubin, Direct: 0.1 mg/dL (ref 0.0–0.2)
Indirect Bilirubin: 0.4 mg/dL (ref 0.3–0.9)
Total Bilirubin: 0.5 mg/dL (ref 0.3–1.2)
Total Protein: 6.9 g/dL (ref 6.5–8.1)

## 2019-01-18 LAB — FIBRINOGEN: Fibrinogen: 543 mg/dL — ABNORMAL HIGH (ref 210–475)

## 2019-01-18 LAB — ABO/RH: ABO/RH(D): O POS

## 2019-01-18 LAB — C-REACTIVE PROTEIN: CRP: 4.9 mg/dL — ABNORMAL HIGH (ref <1.0)

## 2019-01-18 LAB — FERRITIN: Ferritin: 156 ng/mL (ref 11–307)

## 2019-01-18 LAB — PROCALCITONIN: Procalcitonin: <0.1 ng/mL

## 2019-01-18 LAB — D-DIMER, QUANTITATIVE: D-Dimer, Quant: 0.45 ug/mL-FEU (ref 0.00–0.50)

## 2019-01-18 MED ORDER — ACETAMINOPHEN 325 MG PO TABS
650.0000 mg | ORAL_TABLET | Freq: Four times a day (QID) | ORAL | Status: DC | PRN
  Administered 2019-01-22: 650 mg via ORAL

## 2019-01-18 MED ORDER — ONDANSETRON HCL 4 MG/2ML IJ SOLN
4.0000 mg | Freq: Once | INTRAMUSCULAR | Status: AC
  Administered 2019-01-18: 01:00:00 4 mg via INTRAVENOUS

## 2019-01-18 MED ORDER — DEXAMETHASONE SODIUM PHOSPHATE 10 MG/ML IJ SOLN
6.0000 mg | INTRAMUSCULAR | Status: DC
  Administered 2019-01-18 – 2019-01-22 (×5): 6 mg via INTRAVENOUS
  Filled 2019-01-18 (×5): qty 1

## 2019-01-18 MED ORDER — ONDANSETRON HCL 4 MG/2ML IJ SOLN
INTRAMUSCULAR | Status: AC
  Administered 2019-01-18: 4 mg via INTRAVENOUS
  Filled 2019-01-18: qty 2

## 2019-01-18 MED ORDER — POTASSIUM CHLORIDE CRYS ER 20 MEQ PO TBCR
40.0000 meq | EXTENDED_RELEASE_TABLET | Freq: Once | ORAL | Status: AC
  Administered 2019-01-18: 40 meq via ORAL
  Filled 2019-01-18: qty 2

## 2019-01-18 MED ORDER — ALBUTEROL SULFATE HFA 108 (90 BASE) MCG/ACT IN AERS
2.0000 | INHALATION_SPRAY | RESPIRATORY_TRACT | Status: DC
  Administered 2019-01-18 – 2019-01-21 (×21): 2 via RESPIRATORY_TRACT
  Filled 2019-01-18: qty 6.7

## 2019-01-18 MED ORDER — LAMIVUDINE-ZIDOVUDINE 150-300 MG PO TABS
1.0000 | ORAL_TABLET | Freq: Two times a day (BID) | ORAL | Status: DC
  Administered 2019-01-18: 1 via ORAL
  Filled 2019-01-18 (×2): qty 1

## 2019-01-18 MED ORDER — DEXTROSE-NACL 5-0.45 % IV SOLN
INTRAVENOUS | Status: DC
  Administered 2019-01-18: 05:00:00 via INTRAVENOUS

## 2019-01-18 MED ORDER — ZINC SULFATE 220 (50 ZN) MG PO CAPS
220.0000 mg | ORAL_CAPSULE | Freq: Every day | ORAL | Status: DC
  Administered 2019-01-18 – 2019-01-22 (×5): 220 mg via ORAL
  Filled 2019-01-18 (×5): qty 1

## 2019-01-18 MED ORDER — ESTRADIOL 1 MG PO TABS
0.5000 mg | ORAL_TABLET | Freq: Every day | ORAL | Status: DC
  Administered 2019-01-18 – 2019-01-19 (×2): 0.5 mg via ORAL
  Filled 2019-01-18 (×3): qty 0.5

## 2019-01-18 MED ORDER — CLONIDINE HCL 0.1 MG PO TABS
0.4000 mg | ORAL_TABLET | Freq: Every day | ORAL | Status: DC
  Filled 2019-01-18: qty 4

## 2019-01-18 MED ORDER — SODIUM CHLORIDE 0.9 % IV SOLN
200.0000 mg | Freq: Once | INTRAVENOUS | Status: AC
  Administered 2019-01-18: 200 mg via INTRAVENOUS
  Filled 2019-01-18: qty 40

## 2019-01-18 MED ORDER — MELATONIN 5 MG PO TABS
5.0000 mg | ORAL_TABLET | Freq: Every day | ORAL | Status: DC
  Administered 2019-01-18 – 2019-01-21 (×4): 5 mg via ORAL
  Filled 2019-01-18 (×7): qty 1

## 2019-01-18 MED ORDER — VENLAFAXINE HCL 75 MG PO TABS
75.0000 mg | ORAL_TABLET | Freq: Three times a day (TID) | ORAL | Status: DC
  Administered 2019-01-18 – 2019-01-22 (×11): 75 mg via ORAL
  Filled 2019-01-18 (×21): qty 1

## 2019-01-18 MED ORDER — ZOLPIDEM TARTRATE 5 MG PO TABS
5.0000 mg | ORAL_TABLET | Freq: Every evening | ORAL | Status: DC | PRN
  Administered 2019-01-22: 5 mg via ORAL

## 2019-01-18 MED ORDER — ONDANSETRON HCL 4 MG/2ML IJ SOLN
4.0000 mg | Freq: Four times a day (QID) | INTRAMUSCULAR | Status: DC | PRN
  Administered 2019-01-18 – 2019-01-21 (×3): 4 mg via INTRAVENOUS
  Filled 2019-01-18 (×3): qty 2

## 2019-01-18 MED ORDER — HYDROCOD POLST-CPM POLST ER 10-8 MG/5ML PO SUER
5.0000 mL | Freq: Two times a day (BID) | ORAL | Status: DC | PRN
  Administered 2019-01-18 – 2019-01-22 (×3): 5 mL via ORAL
  Filled 2019-01-18 (×4): qty 5

## 2019-01-18 MED ORDER — ENOXAPARIN SODIUM 40 MG/0.4ML ~~LOC~~ SOLN
40.0000 mg | Freq: Every day | SUBCUTANEOUS | Status: DC
  Administered 2019-01-18 – 2019-01-22 (×5): 40 mg via SUBCUTANEOUS
  Filled 2019-01-18 (×5): qty 0.4

## 2019-01-18 MED ORDER — HYDRALAZINE HCL 25 MG PO TABS
25.0000 mg | ORAL_TABLET | Freq: Every day | ORAL | Status: DC
  Administered 2019-01-18: 25 mg via ORAL
  Filled 2019-01-18 (×2): qty 1

## 2019-01-18 MED ORDER — DEXTROSE-NACL 5-0.45 % IV SOLN
INTRAVENOUS | Status: AC
  Administered 2019-01-18 (×2): via INTRAVENOUS

## 2019-01-18 MED ORDER — SODIUM CHLORIDE 0.9 % IV SOLN
100.0000 mg | INTRAVENOUS | Status: AC
  Administered 2019-01-19 – 2019-01-22 (×4): 100 mg via INTRAVENOUS
  Filled 2019-01-18 (×3): qty 100
  Filled 2019-01-18: qty 20

## 2019-01-18 MED ORDER — GUAIFENESIN-DM 100-10 MG/5ML PO SYRP
10.0000 mL | ORAL_SOLUTION | ORAL | Status: DC | PRN
  Administered 2019-01-18: 10 mL via ORAL
  Filled 2019-01-18: qty 10

## 2019-01-18 MED ORDER — PROMETHAZINE HCL 25 MG/ML IJ SOLN
25.0000 mg | Freq: Four times a day (QID) | INTRAMUSCULAR | Status: DC | PRN
  Administered 2019-01-18 – 2019-01-21 (×3): 25 mg via INTRAVENOUS
  Filled 2019-01-18 (×4): qty 1

## 2019-01-18 MED ORDER — ONDANSETRON HCL 4 MG/2ML IJ SOLN
4.0000 mg | Freq: Once | INTRAMUSCULAR | Status: AC
  Administered 2019-01-18: 4 mg via INTRAVENOUS
  Filled 2019-01-18: qty 2

## 2019-01-18 MED ORDER — CLONIDINE HCL 0.1 MG PO TABS
0.2000 mg | ORAL_TABLET | Freq: Every day | ORAL | Status: DC
  Administered 2019-01-18: 0.4 mg via ORAL
  Administered 2019-01-18 – 2019-01-20 (×3): 0.2 mg via ORAL
  Filled 2019-01-18 (×3): qty 2

## 2019-01-18 MED ORDER — GUAIFENESIN-DM 100-10 MG/5ML PO SYRP
10.0000 mL | ORAL_SOLUTION | Freq: Four times a day (QID) | ORAL | Status: DC
  Administered 2019-01-18 – 2019-01-21 (×11): 10 mL via ORAL
  Filled 2019-01-18 (×11): qty 10

## 2019-01-18 MED ORDER — VITAMIN C 500 MG PO TABS
500.0000 mg | ORAL_TABLET | Freq: Every day | ORAL | Status: DC
  Administered 2019-01-18 – 2019-01-22 (×5): 500 mg via ORAL
  Filled 2019-01-18 (×5): qty 1

## 2019-01-18 NOTE — ED Provider Notes (Signed)
[facility]-EMERGENCY DEPT Provider Note   CSN: [id number] Arrival date & time: 01/17/19  2340     History   Chief Complaint Chief Complaint  Patient presents with  . Chest Pain    COVID+  . Shortness of Breath    HPI Caroline Davis is a 58 y.o. female.     Patient with past medical history notable for hypertension, presents to the emergency department with a chief complaint of cough.  She states that she was diagnosed positive with coronavirus approximately 1 week ago.  She states that over the past 2 days she has had intractable nausea and vomiting.  She states that she cannot keep anything down.  She denies any fever at home.  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  The history is provided by the patient. No language interpreter was used.    Past Medical History:  Diagnosis Date  . Allergic rhinitis   . Depression   . Edema   . GERD (gastroesophageal reflux disease)   . Headache   . HTN (hypertension)   . Hypoglycemia   . IBS (irritable bowel syndrome)   . Insomnia   . Rectocele     Patient Active Problem List   Diagnosis Date Noted  . Low back pain 04/06/2018  . Menopausal symptom 04/06/2018  . Anxiety 04/06/2018  . 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 Atypical chest pain 04/06/2018  . Allergic rhinitis   . Headache   . HTN (hypertension)   . Depression   . Hypoglycemia   . Rectocele   . IBS (irritable bowel syndrome)   . GERD (gastroesophageal reflux disease)   . Edema   . Insomnia     Past Surgical History:  Procedure Laterality Date  . APPENDECTOMY    . CHOLECYSTECTOMY  2003  . FEMUR FRACTURE SURGERY  1994  . hysterectomy, bso, endometriosis    . left breast needle biopsy  2016   benign findings     OB History   No obstetric history on file.      Home Medications    Prior to Admission medications   Medication Sig Start Date End Date Taking? Authorizing Provider  chlorthalidone (HYGROTON) 25  MG tablet Take 1 tablet (25 mg total) by mouth daily. 06/24/18   Parke Poisson, MD  cloNIDine (CATAPRES) 0.2 MG tablet Take 0.2 mg by mouth 2 (two) times daily.    [provider]  Crisaborole (EUCRISA) 2 % OINT Apply topically 2 (two) times daily.    [provider]  estradiol (ESTRACE) 0.5 MG tablet Take 0.5 mg by mouth daily.    [provider]  hydrALAZINE (APRESOLINE) 50 MG tablet Take 1 tablet (50 mg total) by mouth 2 (two) times daily. Patient taking differently: Take 50 mg by mouth daily.  06/24/18   Parke Poisson, MD  irbesartan (AVAPRO) 300 MG tablet Take 1 tablet (300 mg total) by mouth daily. 07/31/18   Parke Poisson, MD  Melatonin 5 MG CAPS Take by mouth.    [provider]  OVER THE COUNTER MEDICATION Take 2 capsules by mouth every morning. Mag L Theonate 2000    [provider]  venlafaxine (EFFEXOR) 75 MG tablet Take 1 tablet (75 mg total) by mouth 3 (three) times daily with meals. 03/31/18   Cottle, Steva Ready., MD  zolpidem (AMBIEN) 10 MG tablet 1/2 to 1 tablet at night as needed for sleep 10/20/18  Cottle, Billey Co., MD    Family History Family History  Problem Relation Age of Onset  . Colon cancer Mother   . Bladder Cancer Father   . Prostate cancer Brother 64  . Alzheimer's disease Maternal Grandmother 37  . Lung cancer Maternal Grandfather   . Lung cancer Paternal Grandmother   . Multiple sclerosis Paternal Grandfather     Social History Social History   Tobacco Use  . Smoking status: Never Smoker  . Smokeless tobacco: Never Used  Substance Use Topics  . Alcohol use: No  . Drug use: No     Allergies   Beta adrenergic blockers, Amlodipine, Aripiprazole, and Risperidone and related   Review of Systems Review of Systems  All other systems reviewed and are negative.    Physical Exam Updated Vital Signs BP (!) 159/112   Pulse 87   Temp 99.3 F (37.4 C) (Oral)   Resp 17   Ht 5\' 4"  (1.626 m)    Wt 63.5 kg   SpO2 92%   BMI 24.03 kg/m   Physical Exam Vitals signs and nursing note reviewed.  Constitutional:      General: She is not in acute distress.    Appearance: She is well-developed.  HENT:     Head: Normocephalic and atraumatic.  Eyes:     Conjunctiva/sclera: Conjunctivae normal.  Neck:     Musculoskeletal: Neck supple.  Cardiovascular:     Rate and Rhythm: Normal rate and regular rhythm.     Heart sounds: No murmur.  Pulmonary:     Effort: Pulmonary effort is normal. No respiratory distress.     Breath sounds: Normal breath sounds.  Abdominal:     Palpations: Abdomen is soft.     Tenderness: There is no abdominal tenderness.  Musculoskeletal: Normal range of motion.  Skin:    General: Skin is warm and dry.  Neurological:     Mental Status: She is alert and oriented to person, place, and time.  Psychiatric:        Mood and Affect: Mood normal.        Behavior: Behavior normal.      ED Treatments / Results  Labs (all labs ordered are listed, but only abnormal results are displayed) Labs Reviewed  BASIC METABOLIC PANEL - Abnormal; Notable for the following components:      Result Value   Potassium 3.0 (*)    Glucose, Bld 123 (*)    BUN 21 (*)    All other components within normal limits  POC SARS CORONAVIRUS 2 AG -  ED - Abnormal; Notable for the following components:   SARS Coronavirus 2 Ag POSITIVE (*)    All other components within normal limits  CULTURE, BLOOD (ROUTINE X 2)  CULTURE, BLOOD (ROUTINE X 2)  CBC  HIV ANTIBODY (ROUTINE TESTING W REFLEX)  C-REACTIVE PROTEIN  D-DIMER, QUANTITATIVE (NOT AT [facility])  FERRITIN  FIBRINOGEN  LACTATE DEHYDROGENASE  PROCALCITONIN  HEPATIC FUNCTION PANEL  MAGNESIUM  ALT  POC SARS CORONAVIRUS 2 AG -  ED  ABO/RH  TROPONIN I (HIGH SENSITIVITY)  TROPONIN I (HIGH SENSITIVITY)    EKG EKG Interpretation  Date/Time:  Monday January 18 2019 00:06:29 EST Ventricular Rate:  86 PR Interval:    QRS  Duration: 89 QT Interval:  358 QTC Calculation: 429 R Axis:   -14 Text Interpretation: Atrial flutter with predominant 4:1 AV block RSR' in V1 or V2, right VCD or RVH Borderline T abnormalities, anterior leads Confirmed by  Geoffery LyonsDeLo, Douglas ([id number]) on 01/18/2019 2:04:48 AM   Radiology Dg Chest 2 View  Result Date: 01/18/2019 CLINICAL DATA:  Recent COVID exposure several days ago with shortness of breath EXAM: CHEST - 2 VIEW COMPARISON:  05/25/2017 FINDINGS: Cardiac shadows within normal limits. The lungs are well aerated bilaterally. Patchy airspace opacities are noted left greater than right consistent with the given clinical history of recent COVID-19 exposure. No sizable effusion is noted. No bony abnormality is noted. IMPRESSION: Patchy airspace opacities consistent with the given clinical history of recent COVID-19 exposure. Electronically Signed   By: Alcide CleverMark  Lukens M.D.   On: 01/18/2019 00:37    Procedures Procedures (including critical care time)  Medications Ordered in ED Medications  sodium chloride flush (NS) 0.9 % injection 3 mL ( Intravenous Canceled Entry 01/18/19 0104)  ondansetron (ZOFRAN) injection 4 mg (has no administration in time range)  ondansetron (ZOFRAN) injection 4 mg (4 mg Intravenous Given 01/18/19 0127)  potassium chloride SA (KLOR-CON) CR tablet 40 mEq (40 mEq Oral Given 01/18/19 0335)     Initial Impression / Assessment and Plan / ED Course  I have reviewed the triage vital signs and the nursing notes.  Pertinent labs & imaging results that were available during my care of the patient were reviewed by me and considered in my medical decision making (see chart for details).        Patient with reported recent diagnosis of coronavirus approximately 1 week ago.  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     Patient reports significant cough.  She also reports intractable nausea and vomiting.  She has not been able to tolerate oral intake in  the ED tonight.  Her resting O2 saturation is slowly trending down, currently about 92%.   Given that she cannot tolerate p.o., and her O2 sat is a little low, feel that she would benefit from being admitted to the hospital for close observation.  Will consult hospitalist for admission.  Caroline Davis was evaluated in Emergency Department on 01/18/2019 for the symptoms described in the history of present illness. She was evaluated in the context of the global COVID-19 pandemic, which necessitated consideration that the patient might be at risk for infection with the SARS-CoV-2 virus that causes COVID-19. Institutional protocols and algorithms that pertain to the evaluation of patients at risk for COVID-19 are in a state of rapid change based on information released by regulatory bodies including the CDC and federal and state organizations. These policies and algorithms were followed during the patient's care in the ED.   Final Clinical Impressions(s) / ED Diagnoses   Final diagnoses:  COVID-19    ED Discharge Orders    None       Roxy HorsemanBrowning, Maciah Schweigert, PA-C 01/18/19 [id number]    Geoffery Lyonselo, Douglas, MD 01/19/19 [phone]

## 2019-01-18 NOTE — Progress Notes (Signed)
PROGRESS NOTE    Patient: Caroline Davis                            PCP: Catha GosselinLittle, Kevin, MD                    DOB: 1960-07-28            DOA: 01/17/2019 [id number]             DOS: 01/18/2019, 1:32 PM   LOS: 0 days   Date of Service: The patient was seen and examined on 01/18/2019  Subjective:   Caroline Davis was seen and examined this morning, remained stable in bed still in ED. Reporting mild shortness of breath, but remains on room air, steadily complaining of nausea and vomiting despite given Zofran by RN. Remains dehydrated, phlebotomist have difficulty obtaining labs and securing IV access.   Brief Narrative:   58 year old female with history of hypertension, GERD, IBS, depression presenting with cough shortness of breath nausea vomiting. 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  [place] from [place] with fever, body aches, nausea and vomiting.  Tested positive for Covid a week ago.  Symptoms progressively getting worse over 4 days.  Which includes cough, shortness of breath, nausea, vomiting.  Poor p.o. intake.  ED evaluation: SARS-CoV-2 positive O2 sat 92% on room air, afebrile no leukocytosis, sodium 3.0, troponin negative, EKG negative acute ischemic changes, x-ray bilateral patchy airspace opacity  Assessment & Plan:   Principal Problem:   Pneumonia due to COVID-19 virus Active Problems:   HTN (hypertension)   Acute respiratory failure with hypoxia (HCC)   Viral gastroenteritis   Hypokalemia   Acute hypoxic respiratory failure secondary to COVID-19 viral multifocal pneumonia  -Admitted to Covid unit under negative pressure airborne precaution, close isolation -Rapid SARS-CoV-2 test positive.   -Remain on room air with O2 sat of 92%, anticipating supplemental oxygen to maintain O2 sat greater than 90%  Chest x-ray showing bilateral patchy airspace opacities consistent with COVID-19 pneumonia.  -Per protocol initiating  treatment with IV dexamethasone, remdesivir -Vitamin C and zinc -Supportive therapy, toxins, mucolytic's, Tylenol  -Check inflammatory markers now and monitor daily, monitor CBC and CMP daily COVID-19 Labs Recent Labs    01/18/19 0522  DDIMER 0.45  FERRITIN 156  LDH 182  CRP 4.9*    -Continuous pulse ox -Procalcitonin less than 0.10  --abstaining for antibiotics use at this time  Covid related nausea/vomiting/dehydration  -Symptomatic for past 4 days since + for Covid -Continue antiemetics with Zofran and Phenergan -Continue gentle IV fluid hydration    Goal is to maintain euvolemia net negative volume status given COVID-19 pneumonia.   Hypokalemia -In the setting of dehydration nausea vomiting, potassium 3.0,  -Continue with monitoring and potassium supplementation -Electrolyte including magnesium will be monitored closely and repleted   Hypertension -Continue home clonidine, hydralazine  Depression -Continue home Effexor  Insomnia -Continue home Ambien as needed, melatonin   Cultures; 01/18/2019 blood cultures x2 >>>   Antimicrobials: No antibiotics at this time   DVT prophylaxis: SCD/Compression stockings and Lovenox SQ Code Status:   Code Status: Full Code Family Communication: No family member present at bedside- attempt will be made to update daily The above findings and plan of care has been discussed with patient  in detail,  they expressed understanding and agreement of above. Disposition Plan:  >3 days Admission status:  NPATIEN -  Consultants: None  Procedures:   No admission procedures for hospital encounter.   Antimicrobials:  Anti-infectives (From admission, onward)   Start     Dose/Rate Route Frequency Ordered Stop   01/19/19 1000  remdesivir 100 mg in sodium chloride 0.9 % 250 mL IVPB     100 mg 500 mL/hr over 30 Minutes Intravenous Every 24 hours 01/18/19 0622 01/23/19 0959   01/18/19 1000  lamiVUDine-zidovudine (COMBIVIR)  150-300 MG per tablet 1 tablet     1 tablet Oral 2 times daily 01/18/19 0712     01/18/19 0730  remdesivir 200 mg in sodium chloride 0.9 % 250 mL IVPB     200 mg 500 mL/hr over 30 Minutes Intravenous Once 01/18/19 0622 01/18/19 1151       Medication:  . albuterol  2 puff Inhalation Q4H  . cloNIDine  0.2 mg Oral Daily  . cloNIDine  0.4 mg Oral QHS  . dexamethasone (DECADRON) injection  6 mg Intravenous Q24H  . enoxaparin (LOVENOX) injection  40 mg Subcutaneous Daily  . estradiol  0.5 mg Oral Daily  . guaiFENesin-dextromethorphan  10 mL Oral Q6H  . hydrALAZINE  25 mg Oral QHS  . lamiVUDine-zidovudine  1 tablet Oral BID  . Melatonin  5 mg Oral QHS  . sodium chloride flush  3 mL Intravenous Once  . venlafaxine  75 mg Oral TID WC  . vitamin C  500 mg Oral Daily  . zinc sulfate  220 mg Oral Daily    acetaminophen, chlorpheniramine-HYDROcodone, ondansetron (ZOFRAN) IV, promethazine, zolpidem   Objective:   Vitals:   01/18/19 0338 01/18/19 0830 01/18/19 0930 01/18/19 1130  BP: (!) 159/112 (!) 138/99 (!) 165/80 (!) 176/96  Pulse: 87 71 77 77  Resp:  17 20 17   Temp:      TempSrc:      SpO2: 92% 97% 96% 95%  Weight:      Height:       No intake or output data in the 24 hours ending 01/18/19 1332 Filed Weights   01/18/19 0001  Weight: 63.5 kg     Examination:   Physical Exam  Constitution: SOB with N/V but: Alert, cooperative, no distress,    Psychiatric: Normal and stable mood and affect, cognition intact,   HEENT: Normocephalic, PERRL, otherwise with in Normal limits  Chest: Chest symmetric pulmonary: Diffuse rhonchi, respirations unlabored, negative wheezes / crackles Cardio vascular:  S1/S2, RRR, No murmure, No Rubs or Gallops  Abdomen: Soft, non-tender, non-distended, bowel sounds,no masses, no organomegaly Muscular skeletal: Limited exam - in bed, able to move all 4 extremities, Normal strength,  Neuro: CNII-XII intact. , normal motor and sensation, reflexes  intact  Extremities: No pitting edema lower extremities, +2 pulses  Skin: Dry, warm to touch, negative for any Rashes, No open wounds Wounds: per nursing documentation  LABs:  CBC Latest Ref Rng & Units 01/18/2019  WBC 4.0 - 10.5 K/uL 5.3  Hemoglobin 12.0 - 15.0 g/dL 12.5  Hematocrit 36.0 - 46.0 % 36.8  Platelets 150 - 400 K/uL 283   CMP Latest Ref Rng & Units 01/18/2019  Glucose 70 - 99 mg/dL 123(H)  BUN 6 - 20 mg/dL 21(H)  Creatinine 0.44 - 1.00 mg/dL 0.79  Sodium 135 - 145 mmol/L 140  Potassium 3.5 - 5.1 mmol/L 3.0(L)  Chloride 98 - 111 mmol/L 103  CO2 22 - 32 mmol/L 24  Calcium 8.9 - 10.3 mg/dL 9.0  Total Protein 6.5 - 8.1 g/dL 6.9  Total Bilirubin  0.3 - 1.2 mg/dL 0.5  Alkaline Phos 38 - 126 U/L 114  AST 15 - 41 U/L 33  ALT 0 - 44 U/L 31        SIGNED: Kendell Bane, MD, FACP, FHM. [facility],  Pager [phone]  If 7PM-7AM, please contact night-coverage [email or web address], Password [facility] 01/18/2019, 1:32 PM

## 2019-01-18 NOTE — ED Notes (Signed)
Phlebotomy confirmed they will draw ordered blood work.

## 2019-01-18 NOTE — H&P (Signed)
History and Physical    FAYELYNN DISTEL LKG:[id number] DOB: [birth year] DOA: 01/17/2019  PCP: Catha Gosselin, MD Patient coming from: Home  Chief Complaint: Cough, shortness of breath  HPI: Caroline Davis is a 58 y.o. female with medical history significant of hypertension, GERD, IBS, depression presenting to the ED with complaints of cough and shortness of breath.  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  After returning from [place] she started having fevers and body aches.  She tested positive for Covid a week ago.  For the past 4 days she is having cough, shortness of breath, nausea, and vomiting.  She has not been able to eat much.  ED Course: Rapid SARS-CoV-2 test positive.  Oxygen saturation as low as 92% at rest.  Afebrile and no leukocytosis.  Potassium 3.0.  High-sensitivity troponin negative.  EKG without acute ischemic changes.  Chest x-ray showing bilateral patchy airspace opacities.  Review of Systems:  All systems reviewed and apart from history of presenting illness, are negative.  Past Medical History:  Diagnosis Date  . Allergic rhinitis   . Depression   . Edema   . GERD (gastroesophageal reflux disease)   . Headache   . HTN (hypertension)   . Hypoglycemia   . IBS (irritable bowel syndrome)   . Insomnia   . Rectocele     Past Surgical History:  Procedure Laterality Date  . APPENDECTOMY    . CHOLECYSTECTOMY  2003  . FEMUR FRACTURE SURGERY  1994  . hysterectomy, bso, endometriosis    . left breast needle biopsy  2016   benign findings     reports that she has never smoked. She has never used smokeless tobacco. She reports that she does not drink alcohol or use drugs.  Allergies  Allergen Reactions  . Beta Adrenergic Blockers     DEPRESSION  . Amlodipine     EDEMA   . Aripiprazole     woozy  . Risperidone And Related     suicidal    Family History  Problem  Relation Age of Onset  . Colon cancer Mother   . Bladder Cancer Father   . Prostate cancer Brother 81  . Alzheimer's disease Maternal Grandmother 69  . Lung cancer Maternal Grandfather   . Lung cancer Paternal Grandmother   . Multiple sclerosis Paternal Grandfather     Prior to Admission medications   Medication Sig Start Date End Date Taking? Authorizing Provider  chlorthalidone (HYGROTON) 25 MG tablet Take 1 tablet (25 mg total) by mouth daily. 06/24/18  Yes Parke Poisson, MD  cloNIDine (CATAPRES) 0.2 MG tablet Take 0.2-0.4 mg by mouth See admin instructions. 0.2 mg every morning, and 0.4 mg every night   Yes [provider]  estradiol (ESTRACE) 0.5 MG tablet Take 0.5 mg by mouth daily after breakfast.    Yes [provider]  hydrALAZINE (APRESOLINE) 50 MG tablet Take 1 tablet (50 mg total) by mouth 2 (two) times daily. Patient taking differently: Take 25 mg by mouth at bedtime.  06/24/18  Yes Parke Poisson, MD  irbesartan (AVAPRO) 300 MG tablet Take 1 tablet (300 mg total) by mouth daily. Patient taking differently: Take 150 mg by mouth at bedtime.  07/31/18  Yes Parke Poisson, MD  Melatonin 5 MG CAPS Take 5 mg by mouth at bedtime.    Yes [provider]  Multiple Vitamins-Minerals (MULTIVITAMIN ADULT PO)  Take 1 tablet by mouth daily after breakfast.   Yes [provider]  OVER THE COUNTER MEDICATION Take 2 capsules by mouth at bedtime. [place]    Yes [provider]  venlafaxine (EFFEXOR) 75 MG tablet Take 1 tablet (75 mg total) by mouth 3 (three) times daily with meals. 03/31/18  Yes Cottle, Billey Co., MD  zolpidem (AMBIEN) 10 MG tablet 1/2 to 1 tablet at night as needed for sleep Patient taking differently: Take 10 mg by mouth at bedtime.  10/20/18  Yes Cottle, Billey Co., MD    Physical Exam: Vitals:   01/18/19 0001 01/18/19 0332 01/18/19 0333 01/18/19 0338  BP: (!) 155/85 (!) 151/128 (!) 151/128 (!) 159/112   Pulse: 83 86 94 87  Resp: 17 20 17    Temp: 99.3 F (37.4 C)     TempSrc: Oral     SpO2: 95% 95% 94% 92%  Weight: 63.5 kg     Height: 5\' 4"  (1.626 m)       Physical Exam  Constitutional: She is oriented to person, place, and time. She appears well-developed and well-nourished.  Appears very lethargic  HENT:  Head: Normocephalic.  Eyes: Right eye exhibits no discharge. Left eye exhibits no discharge.  Neck: Neck supple.  Cardiovascular: Normal rate, regular rhythm and intact distal pulses.  Pulmonary/Chest: Effort normal. She has no wheezes. She has no rales.  Coughing aggressively Equal air entry bilaterally upon auscultation of anterior lung fields  Abdominal: Soft. Bowel sounds are normal. She exhibits no distension. There is no abdominal tenderness. There is no guarding.  Musculoskeletal:        General: No edema.  Neurological: She is alert and oriented to person, place, and time.  Skin: Skin is warm and dry. She is not diaphoretic.     Labs on Admission: I have personally reviewed following labs and imaging studies  CBC: Recent Labs  Lab 01/18/19 0105  WBC 5.3  HGB 12.5  HCT 36.8  MCV 91.5  PLT 629   Basic Metabolic Panel: Recent Labs  Lab 01/18/19 0105  NA 140  K 3.0*  CL 103  CO2 24  GLUCOSE 123*  BUN 21*  CREATININE 0.79  CALCIUM 9.0   GFR: Estimated Creatinine Clearance: 67 mL/min (by C-G formula based on SCr of 0.79 mg/dL). Liver Function Tests: No results for input(s): AST, ALT, ALKPHOS, BILITOT, PROT, ALBUMIN in the last 168 hours. No results for input(s): LIPASE, AMYLASE in the last 168 hours. No results for input(s): AMMONIA in the last 168 hours. Coagulation Profile: No results for input(s): INR, PROTIME in the last 168 hours. Cardiac Enzymes: No results for input(s): CKTOTAL, CKMB, CKMBINDEX, TROPONINI in the last 168 hours. BNP (last 3 results) No results for input(s): PROBNP in the last 8760 hours. HbA1C: No results for input(s):  HGBA1C in the last 72 hours. CBG: No results for input(s): GLUCAP in the last 168 hours. Lipid Profile: No results for input(s): CHOL, HDL, LDLCALC, TRIG, CHOLHDL, LDLDIRECT in the last 72 hours. Thyroid Function Tests: No results for input(s): TSH, T4TOTAL, FREET4, T3FREE, THYROIDAB in the last 72 hours. Anemia Panel: No results for input(s): VITAMINB12, FOLATE, FERRITIN, TIBC, IRON, RETICCTPCT in the last 72 hours. Urine analysis: No results found for: COLORURINE, APPEARANCEUR, LABSPEC, [place], GLUCOSEU, HGBUR, BILIRUBINUR, KETONESUR, PROTEINUR, UROBILINOGEN, NITRITE, LEUKOCYTESUR  Radiological Exams on Admission: Dg Chest 2 View  Result Date: 01/18/2019 CLINICAL DATA:  Recent COVID exposure several days ago with shortness of  breath EXAM: CHEST - 2 VIEW COMPARISON:  05/25/2017 FINDINGS: Cardiac shadows within normal limits. The lungs are well aerated bilaterally. Patchy airspace opacities are noted left greater than right consistent with the given clinical history of recent COVID-19 exposure. No sizable effusion is noted. No bony abnormality is noted. IMPRESSION: Patchy airspace opacities consistent with the given clinical history of recent COVID-19 exposure. Electronically Signed   By: Alcide CleverMark  Lukens M.D.   On: 01/18/2019 00:37    EKG: Independently reviewed.  Sinus rhythm, nonspecific T wave abnormalities.  No acute ischemic changes.  Assessment/Plan Principal Problem:   Pneumonia due to COVID-19 virus Active Problems:   HTN (hypertension)   Acute respiratory failure with hypoxia (HCC)   Viral gastroenteritis   Hypokalemia   Acute hypoxic respiratory failure secondary to COVID-19 viral multifocal pneumonia  Rapid SARS-CoV-2 test positive.  Oxygen saturation intermittently as low as 92% at rest.  Afebrile and no leukocytosis.  Chest x-ray showing bilateral patchy airspace opacities consistent with COVID-19 pneumonia. -Remdesivir dosing per pharmacy.  Check LFTs. -IV Decadron 6 mg  daily -Lovenox for VTE prophylaxis -Vitamin C and zinc -Antitussives as needed -Tylenol as needed -Check inflammatory markers now and monitor daily, monitor CBC and CMP daily -Airborne and contact precautions -Continuous pulse ox -Supplemental oxygen as needed to keep oxygen saturation above 90% -Blood culture x2 -Check procalcitonin level  Acute viral gastroenteritis Patient is positive for COVID-19.  Reports 4-day history of nausea and vomiting.  Abdominal exam benign and no complaints of abdominal pain. -Appears dehydrated.  Give gentle IV fluid hydration.  Goal is to maintain euvolemia net negative volume status given COVID-19 pneumonia. -Zofran as needed  Hypokalemia In the setting of GI loss from vomiting.  Potassium 3.0. -Potassium supplementation.  Check magnesium level.  Continue to monitor electrolytes and replete if low.  Hypertension -Continue home clonidine, hydralazine  Depression -Continue home Effexor  Insomnia -Continue home Ambien as needed, melatonin  DVT prophylaxis: Lovenox Code Status: Full code Family Communication: No family available. Disposition Plan: Anticipate discharge after clinical improvement. Consults called: None Admission status: It is my clinical opinion that admission to INPATIENT is reasonable and necessary in this 58 y.o. female . presenting with acute hypoxic respiratory failure secondary to COVID-19 viral multifocal pneumonia.  High risk of decompensation.  Given the aforementioned, the predictability of an adverse outcome is felt to be significant. I expect that the patient will require at least 2 midnights in the hospital to treat this condition.   The medical decision making on this patient was of high complexity and the patient is at high risk for clinical deterioration, therefore this is a level 3 visit.  John GiovanniVasundhra Tarin Navarez MD [facility] Pager [phone]  If 7PM-7AM, please contact night-coverage [email or web address]  Password [email or web address]  01/18/2019, 5:13 AM

## 2019-01-18 NOTE — ED Notes (Addendum)
Attempted to locate vein to draw ABO/Rh and 1 set of blood cultures, but was unsuccessful. Will contact phlebotomy and request assistance. Pt was provided a bsc at this time. No other concerns expressed at this time. RN notified.

## 2019-01-19 LAB — COMPREHENSIVE METABOLIC PANEL
ALT: 33 U/L (ref 0–44)
AST: 36 U/L (ref 15–41)
Albumin: 3.1 g/dL — ABNORMAL LOW (ref 3.5–5.0)
Alkaline Phosphatase: 103 U/L (ref 38–126)
Anion gap: 11 (ref 5–15)
BUN: 12 mg/dL (ref 6–20)
CO2: 25 mmol/L (ref 22–32)
Calcium: 8.6 mg/dL — ABNORMAL LOW (ref 8.9–10.3)
Chloride: 103 mmol/L (ref 98–111)
Creatinine, Ser: 0.57 mg/dL (ref 0.44–1.00)
GFR calc Af Amer: >60 mL/min (ref >60)
GFR calc non Af Amer: >60 mL/min (ref >60)
Glucose, Bld: 117 mg/dL — ABNORMAL HIGH (ref 70–99)
Potassium: 3.4 mmol/L — ABNORMAL LOW (ref 3.5–5.1)
Sodium: 139 mmol/L (ref 135–145)
Total Bilirubin: 0.5 mg/dL (ref 0.3–1.2)
Total Protein: 6.7 g/dL (ref 6.5–8.1)

## 2019-01-19 LAB — CBC WITH DIFFERENTIAL/PLATELET
Abs Immature Granulocytes: 0.02 10*3/uL (ref 0.00–0.07)
Basophils Absolute: 0 10*3/uL (ref 0.0–0.1)
Basophils Relative: 0 %
Eosinophils Absolute: 0 10*3/uL (ref 0.0–0.5)
Eosinophils Relative: 0 %
HCT: 36.2 % (ref 36.0–46.0)
Hemoglobin: 12 g/dL (ref 12.0–15.0)
Immature Granulocytes: 1 %
Lymphocytes Relative: 27 %
Lymphs Abs: 1 10*3/uL (ref 0.7–4.0)
MCH: 31 pg (ref 26.0–34.0)
MCHC: 33.1 g/dL (ref 30.0–36.0)
MCV: 93.5 fL (ref 80.0–100.0)
Monocytes Absolute: 0.4 10*3/uL (ref 0.1–1.0)
Monocytes Relative: 10 %
Neutro Abs: 2.4 10*3/uL (ref 1.7–7.7)
Neutrophils Relative %: 62 %
Platelets: 238 10*3/uL (ref 150–400)
RBC: 3.87 MIL/uL (ref 3.87–5.11)
RDW: 11.9 % (ref 11.5–15.5)
WBC: 3.8 10*3/uL — ABNORMAL LOW (ref 4.0–10.5)
nRBC: 0 % (ref 0.0–0.2)

## 2019-01-19 LAB — C-REACTIVE PROTEIN: CRP: 5.2 mg/dL — ABNORMAL HIGH (ref <1.0)

## 2019-01-19 LAB — D-DIMER, QUANTITATIVE: D-Dimer, Quant: 0.48 ug/mL-FEU (ref 0.00–0.50)

## 2019-01-19 LAB — FIBRINOGEN: Fibrinogen: 556 mg/dL — ABNORMAL HIGH (ref 210–475)

## 2019-01-19 LAB — LACTATE DEHYDROGENASE: LDH: 203 U/L — ABNORMAL HIGH (ref 98–192)

## 2019-01-19 MED ORDER — POTASSIUM CHLORIDE CRYS ER 20 MEQ PO TBCR
40.0000 meq | EXTENDED_RELEASE_TABLET | Freq: Once | ORAL | Status: AC
  Administered 2019-01-19: 40 meq via ORAL
  Filled 2019-01-19: qty 2

## 2019-01-19 MED ORDER — POTASSIUM CHLORIDE IN NACL 20-0.9 MEQ/L-% IV SOLN
INTRAVENOUS | Status: AC
  Administered 2019-01-19: 14:00:00 via INTRAVENOUS
  Filled 2019-01-19: qty 1000

## 2019-01-19 NOTE — Progress Notes (Signed)
PROGRESS NOTE    Patient: Caroline Davis                            PCP: Catha GosselinLittle, Kevin, MD                    DOB: 12-01-1960            DOA: 01/17/2019 [id number]             DOS: 01/19/2019, 1:15 PM   LOS: 1 day   Date of Service: The patient was seen and examined on 01/19/2019  Subjective:   Caroline Davis was seen and examined this morning, stable, she has been on room air, satting 99%, remained mildly tachypneic respiratory rate as high as 26, currently 21.  Mildly hypotensive blood pressure is low was 95/57, currently 100/58. Overnight she had persistent nausea vomiting, alternating Zofran and Phenergan was utilized. She is attempting some p.o. intake today. Denies any diarrhea.   Brief Narrative:   58 year old female with history of hypertension, GERD, IBS, depression presenting with cough shortness of breath nausea vomiting. 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  Returns from [place] with fever, body aches, nausea and vomiting.  Tested positive for Covid a week ago.  Symptoms progressively getting worse over 4 days.  Which includes cough, shortness of breath, nausea, vomiting.  Poor p.o. intake.  ED evaluation: SARS-CoV-2 positive O2 sat 92% on room air, afebrile no leukocytosis, sodium 3.0, troponin negative, EKG negative acute ischemic changes, x-ray bilateral patchy airspace opacity  Assessment & Plan:   Principal Problem:   Pneumonia due to COVID-19 virus Active Problems:   HTN (hypertension)   Acute respiratory failure with hypoxia (HCC)   Viral gastroenteritis   Hypokalemia   Acute hypoxic respiratory failure secondary to COVID-19 viral multifocal pneumonia  -She currently remains stable off supplemental oxygen, satting 91-100%, currently 99%, -Remains mildly tachypneic, hypotensive  -Rapid SARS-CoV-2 test positive.   -Remain on room air with O2 sat of 99%,  -anticipating supplemental oxygen to maintain O2 > 92%  Chest x-ray showing bilateral patchy airspace opacities consistent with COVID-19 pneumonia.  -Per protocol initiating treatment with IV dexamethasone, remdesivir -Vitamin C and zinc -Supportive therapy, toxins, mucolytic's, Tylenol  -Check inflammatory markers now and monitor daily, monitor CBC and CMP daily COVID-19 Labs Recent Labs    01/18/19 0522 01/19/19 0453  DDIMER 0.45 0.48  FERRITIN 156  --   LDH 182 203*  CRP 4.9* 5.2*    -Continuous pulse ox -Procalcitonin less than 0.10  --abstaining for antibiotics use at this time  Covid related nausea/vomiting/dehydration  -Symptomatic for past 4 days since + for Covid -Continue to be symptomatic, remained to have nausea, vomiting has improved -Continue antiemetics with Zofran and Phenergan -Continue gentle IV fluid hydration    Goal is to maintain euvolemia net negative volume status given COVID-19 pneumonia.  Hypotensive -Exacerbated by dehydration -Continue to hold home hypertensive medications, continue gentle IV fluid hydration -Encouraging p.o. intake  Hypokalemia -In the setting of dehydration nausea vomiting, potassium 3.0, >>> 3.4 -Continue with monitoring and potassium supplementation -Electrolyte including magnesium will be monitored closely and repleted   Hypertension /hypotensive -We will hold home medications of clonidine, hydralazine  Depression -Continue home Effexor  Insomnia -Continue home Ambien as needed, melatonin   Cultures; 01/18/2019 blood cultures x2 >>>   Antimicrobials: No antibiotics at this time   DVT prophylaxis: SCD/Compression  stockings and Lovenox SQ Code Status:   Code Status: Full Code Family Communication: No family member present at bedside- attempt will be made to update daily The above findings and plan of care has been discussed with patient  in detail,  they expressed understanding and agreement of above. Disposition Plan:  >3 days Admission status:  NPATIEN -    Consultants: None  Procedures:   No admission procedures for hospital encounter.   Antimicrobials:  Anti-infectives (From admission, onward)   Start     Dose/Rate Route Frequency Ordered Stop   01/19/19 1000  remdesivir 100 mg in sodium chloride 0.9 % 250 mL IVPB     100 mg 500 mL/hr over 30 Minutes Intravenous Every 24 hours 01/18/19 0622 01/23/19 0959   01/18/19 1000  lamiVUDine-zidovudine (COMBIVIR) 150-300 MG per tablet 1 tablet  Status:  Discontinued     1 tablet Oral 2 times daily 01/18/19 0712 01/19/19 1200   01/18/19 0730  remdesivir 200 mg in sodium chloride 0.9 % 250 mL IVPB     200 mg 500 mL/hr over 30 Minutes Intravenous Once 01/18/19 0622 01/18/19 1151       Medication:  . albuterol  2 puff Inhalation Q4H  . cloNIDine  0.2 mg Oral Daily  . cloNIDine  0.4 mg Oral QHS  . dexamethasone (DECADRON) injection  6 mg Intravenous Q24H  . enoxaparin (LOVENOX) injection  40 mg Subcutaneous Daily  . estradiol  0.5 mg Oral Daily  . guaiFENesin-dextromethorphan  10 mL Oral Q6H  . hydrALAZINE  25 mg Oral QHS  . Melatonin  5 mg Oral QHS  . sodium chloride flush  3 mL Intravenous Once  . venlafaxine  75 mg Oral TID WC  . vitamin C  500 mg Oral Daily  . zinc sulfate  220 mg Oral Daily    acetaminophen, chlorpheniramine-HYDROcodone, ondansetron (ZOFRAN) IV, promethazine, zolpidem   Objective:   Vitals:   01/19/19 1100 01/19/19 1214 01/19/19 1230 01/19/19 1300  BP: 106/67 112/69 100/60 (!) 100/58  Pulse: 66 75 69 76  Resp: 19 20 16  (!) 21  Temp:      TempSrc:      SpO2: 96% 97% 97% 99%  Weight:      Height:        Intake/Output Summary (Last 24 hours) at 01/19/2019 1315 Last data filed at 01/19/2019 1056 Gross per 24 hour  Intake 1257.45 ml  Output -  Net 1257.45 ml   Filed Weights   01/18/19 0001 01/19/19 0715  Weight: 63.5 kg 73.5 kg     Examination:   Physical Exam  Constitution: Shortness of breath, but on room air, complaining of nausea, no  obvious vomiting.  Poor p.o. intake.  Persistent dry cough.    BP (!) 100/58   Pulse 76   Temp (!) 97.5 F (36.4 C) (Oral)   Resp (!) 21   Ht 5\' 10"  (1.778 m)   Wt 73.5 kg   SpO2 99%   BMI 23.24 kg/m   Psychiatric: Normal and stable mood and affect, cognition intact,   HEENT: Normocephalic, PERRL, otherwise with in Normal limits  Chest:Chest symmetric Cardio vascular:  S1/S2, RRR, No murmure, No Rubs or Gallops  pulmonary: Clear to auscultation bilaterally, respirations unlabored, negative wheezes / crackles Abdomen: Soft, non-tender, non-distended, bowel sounds,no masses, no organomegaly Muscular skeletal: Limited exam - in bed, able to move all 4 extremities, Normal strength,  Neuro: CNII-XII intact. , normal motor and sensation, reflexes intact  Extremities:  No pitting edema lower extremities, +2 pulses  Skin: Dry, warm to touch, negative for any Rashes, No open wounds Wounds: per nursing documentation         LABs:  CBC Latest Ref Rng & Units 01/19/2019 01/18/2019  WBC 4.0 - 10.5 K/uL 3.8(L) 5.3  Hemoglobin 12.0 - 15.0 g/dL 27.7 41.2  Hematocrit 87.8 - 46.0 % 36.2 36.8  Platelets 150 - 400 K/uL 238 283   CMP Latest Ref Rng & Units 01/19/2019 01/18/2019  Glucose 70 - 99 mg/dL 676(H) 209(O)  BUN 6 - 20 mg/dL 12 70(J)  Creatinine 6.28 - 1.00 mg/dL 3.66 2.94  Sodium 765 - 145 mmol/L 139 140  Potassium 3.5 - 5.1 mmol/L 3.4(L) 3.0(L)  Chloride 98 - 111 mmol/L 103 103  CO2 22 - 32 mmol/L 25 24  Calcium 8.9 - 10.3 mg/dL 4.6(T) 9.0  Total Protein 6.5 - 8.1 g/dL 6.7 6.9  Total Bilirubin 0.3 - 1.2 mg/dL 0.5 0.5  Alkaline Phos 38 - 126 U/L 103 114  AST 15 - 41 U/L 36 33  ALT 0 - 44 U/L 33 31        SIGNED: Kendell Bane, MD, FACP, FHM. [facility],  Pager [phone]  If 7PM-7AM, please contact night-coverage [email or web address], Password [facility] 01/19/2019, 1:15 PM

## 2019-01-19 NOTE — ED Notes (Signed)
Breakfast tray given. °

## 2019-01-19 NOTE — ED Notes (Signed)
[facility] called for transport, papers at bedside. Attempted to call report, but RN was busy and she will call back.

## 2019-01-19 NOTE — ED Notes (Signed)
ED TO INPATIENT HANDOFF REPORT  ED Nurse Name and Phone #: [place] G, [phone]  S Name/Age/Gender Caroline Davis 58 y.o. female Room/Bed: WA22/WA22  Code Status   Code Status: Full Code  Home/SNF/Other Home Patient oriented to: self, place, time and situation Is this baseline? 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 She is complaining of emesis, headache, body aches, fatigue, SOB, and chest pain.   Allergies Allergies  Allergen Reactions  . Beta Adrenergic Blockers     DEPRESSION  . Amlodipine     EDEMA   . Aripiprazole     woozy  . Risperidone And Related     suicidal    Level of Care/Admitting Diagnosis ED Disposition    ED Disposition Condition [facility]: [place] [[id number]]  Level of Care: Telemetry [5]  Covid Evaluation: Confirmed COVID Positive  Diagnosis: Pneumonia due to COVID-19 virus [[id number]]  Admitting Physician: Shela Leff [[id number]]  Attending Physician: Shela Leff [[id number]]  Estimated length of stay: past midnight tomorrow  Certification:: I certify this patient will need inpatient services for at least 2 midnights  PT Class (Do Not Modify): Inpatient [101]  PT Acc Code (Do Not Modify): Private [1]       B Medical/Surgery History Past Medical History:  Diagnosis Date  . Allergic rhinitis   . Depression   . Edema   . GERD (gastroesophageal reflux disease)   . Headache   . HTN (hypertension)   . Hypoglycemia   . IBS (irritable bowel syndrome)   . Insomnia   . Rectocele    Past Surgical History:  Procedure Laterality Date  . APPENDECTOMY    . CHOLECYSTECTOMY  2003  . [facility]  . hysterectomy, bso, endometriosis    . left breast needle biopsy  2016   benign findings     A IV Location/Drains/Wounds Patient  Lines/Drains/Airways Status   Active Line/Drains/Airways    Name:   Placement date:   Placement time:   Site:   Days:   Peripheral IV 01/18/19 Right Wrist   01/18/19    0104    Wrist   1          Intake/Output Last 24 hours  Intake/Output Summary (Last 24 hours) at 01/19/2019 1646 Last data filed at 01/19/2019 1056 Gross per 24 hour  Intake 1257.45 ml  Output -  Net 1257.45 ml    Labs/Imaging Results for orders placed or performed during the hospital encounter of 01/17/19 (from the past 48 hour(s))  Basic metabolic panel     Status: Abnormal   Collection Time: 01/18/19  1:05 AM  Result Value Ref Range   Sodium 140 135 - 145 mmol/L   Potassium 3.0 (L) 3.5 - 5.1 mmol/L   Chloride 103 98 - 111 mmol/L   CO2 24 22 - 32 mmol/L   Glucose, Bld 123 (H) 70 - 99 mg/dL   BUN 21 (H) 6 - 20 mg/dL   Creatinine, Ser 0.79 0.44 - 1.00 mg/dL   Calcium 9.0 8.9 - 10.3 mg/dL   GFR calc non Af Amer >60 >60 mL/min   GFR calc Af Amer >60 >60 mL/min   Anion gap 13 5 - 15    Comment: Performed at [facility], [place] [address] [place], [place]  [address]  CBC     Status: None   Collection Time: 01/18/19  1:05 AM  Result Value Ref Range   WBC 5.3 4.0 - 10.5 K/uL   RBC 4.02 3.87 - 5.11 MIL/uL   Hemoglobin 12.5 12.0 - 15.0 g/dL   HCT 36.8 36.0 - 46.0 %   MCV 91.5 80.0 - 100.0 fL   MCH 31.1 26.0 - 34.0 pg   MCHC 34.0 30.0 - 36.0 g/dL   RDW 11.7 11.5 - 15.5 %   Platelets 283 150 - 400 K/uL   nRBC 0.0 0.0 - 0.2 %    Comment: Performed at [facility], [place] [address], [place], [place] [address]  Troponin I (High Sensitivity)     Status: None   Collection Time: 01/18/19  1:05 AM  Result Value Ref Range   Troponin I (High Sensitivity) 2 <18 ng/L    Comment: (NOTE) Elevated high sensitivity troponin I (hsTnI) values and significant  changes across serial measurements may suggest ACS but many other  chronic and acute conditions are known to elevate hsTnI  results.  Refer to the "Links" section for chest pain algorithms and additional  guidance. Performed at [facility], [place] [address] [place], [place] [address]   ABO/Rh     Status: None   Collection Time: 01/18/19  1:05 AM  Result Value Ref Range   ABO/RH(D)      O POS Performed at [facility], [place] [address] [place], [place] [address]   POC SARS Coronavirus 2 Ag-ED - Nasal Swab (BD Veritor Kit)     Status: Abnormal   Collection Time: 01/18/19  4:34 AM  Result Value Ref Range   SARS Coronavirus 2 Ag POSITIVE (A) NEGATIVE    Comment: (NOTE) SARS-CoV-2 antigen PRESENT. Positive results indicate the presence of viral antigens, but clinical correlation with patient history and other diagnostic information is necessary to determine patient infection status.  Positive results do not rule out bacterial infection or co-infection  with other viruses. False positive results are rare but can occur, and confirmatory RT-PCR testing may be appropriate in some circumstances. The expected result is Negative. Fact Sheet for Patients: PodPark.tn Fact Sheet for Providers: GiftContent.is  This test is not yet approved or cleared by the [place] FDA and  has been authorized for detection and/or diagnosis of SARS-CoV-2 by FDA under an Emergency Use Authorization (EUA).  This EUA will remain in effect (meaning this test can be used) for the duration of  the COVID-19 declaration under Section 564(b)(1) of the Act, 21 U.S.C. section 360bbb-3(b)(1), unless the a uthorization is terminated or revoked sooner.   Troponin I (High Sensitivity)     Status: None   Collection Time: 01/18/19  5:22 AM  Result Value Ref Range   Troponin I (High Sensitivity) 2 <18 ng/L    Comment: (NOTE) Elevated high sensitivity troponin I (hsTnI) values and significant  changes across serial measurements may suggest ACS but  many other  chronic and acute conditions are known to elevate hsTnI results.  Refer to the "Links" section for chest pain algorithms and additional  guidance. Performed at [facility], [place] [address], [place],  [address]   C-reactive protein     Status: Abnormal   Collection Time: 01/18/19  5:22 AM  Result Value Ref Range   CRP 4.9 (H) <1.0 mg/dL    Comment: Performed at [facility], [place] [address] [place], [place]  [address]  D-dimer, quantitative (not at [facility])     Status: None   Collection Time: 01/18/19  5:22 AM  Result Value Ref Range   D-Dimer, Quant 0.45 0.00 - 0.50 ug/mL-FEU    Comment: (NOTE) At the manufacturer cut-off of 0.50 ug/mL FEU, this assay has been documented to exclude PE with a sensitivity and negative predictive value of 97 to 99%.  At this time, this assay has not been approved by the FDA to exclude DVT/VTE. Results should be correlated with clinical presentation. Performed at [facility], [place] [address] [place], [place] [address]   Ferritin     Status: None   Collection Time: 01/18/19  5:22 AM  Result Value Ref Range   Ferritin 156 11 - 307 ng/mL    Comment: Performed at [facility], [place] [address], [place], [place] [address]  Fibrinogen     Status: Abnormal   Collection Time: 01/18/19  5:22 AM  Result Value Ref Range   Fibrinogen 543 (H) 210 - 475 mg/dL    Comment: Performed at [facility], [place] [address] [place], [place] [address]  Lactate dehydrogenase     Status: None   Collection Time: 01/18/19  5:22 AM  Result Value Ref Range   LDH 182 98 - 192 U/L    Comment: Performed at [facility], [place] [address] [place],  [address]  Procalcitonin     Status: None   Collection Time: 01/18/19  5:22 AM  Result Value Ref Range   Procalcitonin <0.10 ng/mL    Comment:        Interpretation: PCT (Procalcitonin) <= 0.5  ng/mL: Systemic infection (sepsis) is not likely. Local bacterial infection is possible. (NOTE)       Sepsis PCT Algorithm           Lower Respiratory Tract                                      Infection PCT Algorithm    ----------------------------     ----------------------------         PCT < 0.25 ng/mL                PCT < 0.10 ng/mL         Strongly encourage             Strongly discourage   discontinuation of antibiotics    initiation of antibiotics    ----------------------------     -----------------------------       PCT 0.25 - 0.50 ng/mL            PCT 0.10 - 0.25 ng/mL               OR       >80% decrease in PCT            Discourage initiation of                                            antibiotics      Encourage discontinuation           of antibiotics    ----------------------------     -----------------------------         PCT >= 0.50 ng/mL  PCT 0.26 - 0.50 ng/mL               AND        <80% decrease in PCT             Encourage initiation of                                             antibiotics       Encourage continuation           of antibiotics    ----------------------------     -----------------------------        PCT >= 0.50 ng/mL                  PCT > 0.50 ng/mL               AND         increase in PCT                  Strongly encourage                                      initiation of antibiotics    Strongly encourage escalation           of antibiotics                                     -----------------------------                                           PCT <= 0.25 ng/mL                                                 OR                                        > 80% decrease in PCT                                     Discontinue / Do not initiate                                             antibiotics Performed at [place] [address], [place], [place] [address]   Hepatic function panel     Status:  Abnormal   Collection Time: 01/18/19  5:22 AM  Result Value Ref Range   Total Protein 6.9 6.5 - 8.1 g/dL   Albumin 3.4 (L) 3.5 - 5.0 g/dL   AST 33 15 - 41 U/L   ALT 31 0 - 44 U/L   Alkaline Phosphatase 114 38 - 126 U/L  Total Bilirubin 0.5 0.3 - 1.2 mg/dL   Bilirubin, Direct 0.1 0.0 - 0.2 mg/dL   Indirect Bilirubin 0.4 0.3 - 0.9 mg/dL    Comment: Performed at [facility], [place] [address], [place], [place] [address]  Magnesium     Status: Abnormal   Collection Time: 01/18/19  5:22 AM  Result Value Ref Range   Magnesium 1.6 (L) 1.7 - 2.4 mg/dL    Comment: Performed at [facility], [place] [address], [place], [place] [address]  Culture, blood (routine x 2)     Status: None (Preliminary result)   Collection Time: 01/18/19  9:50 AM   Specimen: BLOOD LEFT HAND  Result Value Ref Range   Specimen Description      BLOOD LEFT HAND Performed at [place] [address], [place], [place] [address]    Special Requests      BOTTLES DRAWN AEROBIC ONLY Blood Culture adequate volume Performed at [place] [address] [place], [place] [address]    Culture      NO GROWTH 1 DAY Performed at [place] [address] [place], [place] [address]    Report Status PENDING   Culture, blood (routine x 2)     Status: None (Preliminary result)   Collection Time: 01/18/19  9:50 AM   Specimen: BLOOD LEFT HAND  Result Value Ref Range   Specimen Description      BLOOD LEFT HAND Performed at [place] [address], [place], [place] [address]    Special Requests      BOTTLES DRAWN AEROBIC ONLY Blood Culture adequate volume Performed at [place] [address] [place], [place] [address]    Culture      NO GROWTH 1 DAY Performed at [place] [address], [place], [place] [address]    Report Status PENDING   CBC with Differential/Platelet      Status: Abnormal   Collection Time: 01/19/19  4:53 AM  Result Value Ref Range   WBC 3.8 (L) 4.0 - 10.5 K/uL   RBC 3.87 3.87 - 5.11 MIL/uL   Hemoglobin 12.0 12.0 - 15.0 g/dL   HCT 36.2 36.0 - 46.0 %   MCV 93.5 80.0 - 100.0 fL   MCH 31.0 26.0 - 34.0 pg   MCHC 33.1 30.0 - 36.0 g/dL   RDW 11.9 11.5 - 15.5 %   Platelets 238 150 - 400 K/uL   nRBC 0.0 0.0 - 0.2 %   Neutrophils Relative % 62 %   Neutro Abs 2.4 1.7 - 7.7 K/uL   Lymphocytes Relative 27 %   Lymphs Abs 1.0 0.7 - 4.0 K/uL   Monocytes Relative 10 %   Monocytes Absolute 0.4 0.1 - 1.0 K/uL   Eosinophils Relative 0 %   Eosinophils Absolute 0.0 0.0 - 0.5 K/uL   Basophils Relative 0 %   Basophils Absolute 0.0 0.0 - 0.1 K/uL   Immature Granulocytes 1 %   Abs Immature Granulocytes 0.02 0.00 - 0.07 K/uL    Comment: Performed at [facility], [place] [address] [place], [place] [address]  Comprehensive metabolic panel     Status: Abnormal   Collection Time: 01/19/19  4:53 AM  Result Value Ref Range   Sodium 139 135 - 145 mmol/L   Potassium 3.4 (L) 3.5 - 5.1 mmol/L   Chloride 103 98 - 111 mmol/L   CO2 25 22 -  32 mmol/L   Glucose, Bld 117 (H) 70 - 99 mg/dL   BUN 12 6 - 20 mg/dL   Creatinine, Ser 0.57 0.44 - 1.00 mg/dL   Calcium 8.6 (L) 8.9 - 10.3 mg/dL   Total Protein 6.7 6.5 - 8.1 g/dL   Albumin 3.1 (L) 3.5 - 5.0 g/dL   AST 36 15 - 41 U/L   ALT 33 0 - 44 U/L   Alkaline Phosphatase 103 38 - 126 U/L   Total Bilirubin 0.5 0.3 - 1.2 mg/dL   GFR calc non Af Amer >60 >60 mL/min   GFR calc Af Amer >60 >60 mL/min   Anion gap 11 5 - 15    Comment: Performed at [facility], [place] [address] [place], [place] [address]  C-reactive protein     Status: Abnormal   Collection Time: 01/19/19  4:53 AM  Result Value Ref Range   CRP 5.2 (H) <1.0 mg/dL    Comment: Performed at [facility], [place] [address] [place], [place] [address]  D-dimer, quantitative (not at [facility])     Status: None    Collection Time: 01/19/19  4:53 AM  Result Value Ref Range   D-Dimer, Quant 0.48 0.00 - 0.50 ug/mL-FEU    Comment: (NOTE) At the manufacturer cut-off of 0.50 ug/mL FEU, this assay has been documented to exclude PE with a sensitivity and negative predictive value of 97 to 99%.  At this time, this assay has not been approved by the FDA to exclude DVT/VTE. Results should be correlated with clinical presentation. Performed at [facility], [place] [address], [place], [place] [address]   Lactate dehydrogenase     Status: Abnormal   Collection Time: 01/19/19  4:53 AM  Result Value Ref Range   LDH 203 (H) 98 - 192 U/L    Comment: Performed at [facility], [place] [address] [place], [place] [address]  Fibrinogen     Status: Abnormal   Collection Time: 01/19/19  4:53 AM  Result Value Ref Range   Fibrinogen 556 (H) 210 - 475 mg/dL    Comment: Performed at [facility], [place] [address], [place], [place] [address]   Dg Chest 2 View  Result Date: 01/18/2019 CLINICAL DATA:  Recent COVID exposure several days ago with shortness of breath EXAM: CHEST - 2 VIEW COMPARISON:  05/25/2017 FINDINGS: Cardiac shadows within normal limits. The lungs are well aerated bilaterally. Patchy airspace opacities are noted left greater than right consistent with the given clinical history of recent COVID-19 exposure. No sizable effusion is noted. No bony abnormality is noted. IMPRESSION: Patchy airspace opacities consistent with the given clinical history of recent COVID-19 exposure. Electronically Signed   By: Inez Catalina M.D.   On: 01/18/2019 00:37    Pending Labs Unresulted Labs (From admission, onward)    Start     Ordered   01/19/19 0500  CBC with Differential/Platelet  Daily,   R     01/18/19 0445   01/19/19 0500  Comprehensive metabolic panel  Daily,   R     01/18/19 0445   01/19/19 0500  C-reactive protein  Daily,   R     01/18/19 0445   01/19/19 0500   D-dimer, quantitative (not at [facility])  Daily,   R     01/18/19 0445   01/19/19 0500  Lactate dehydrogenase  Daily,   R     01/18/19 0445   01/19/19 0500  Fibrinogen  Daily,   R     01/18/19 0715   01/18/19 0546  HIV Antibody (routine testing w rflx)  Once,   R     01/18/19 0546          Vitals/Pain Today's Vitals   01/19/19 1430 01/19/19 1500 01/19/19 1530 01/19/19 1600  BP: 109/66 117/69 112/65 115/74  Pulse: 70 66 69 76  Resp: 14 12 (!) 23 19  Temp:      TempSrc:      SpO2: 97% 98% 97% 97%  Weight:      Height:      PainSc:        Isolation Precautions Airborne and Contact precautions  Medications Medications  sodium chloride flush (NS) 0.9 % injection 3 mL ( Intravenous Canceled Entry 01/18/19 0104)  ondansetron (ZOFRAN) injection 4 mg (4 mg Intravenous Given 01/18/19 2253)  cloNIDine (CATAPRES) tablet 0.2 mg (0.2 mg Oral Given 01/19/19 0859)  venlafaxine (EFFEXOR) tablet 75 mg (75 mg Oral Given 01/19/19 1425)  zolpidem (AMBIEN) tablet 5 mg (has no administration in time range)  estradiol (ESTRACE) tablet 0.5 mg (0.5 mg Oral Given 01/19/19 0858)  Melatonin TABS 5 mg (5 mg Oral Given 01/18/19 2150)  enoxaparin (LOVENOX) injection 40 mg (40 mg Subcutaneous Given 01/19/19 0857)  dexamethasone (DECADRON) injection 6 mg (6 mg Intravenous Given 01/19/19 0718)  chlorpheniramine-HYDROcodone (TUSSIONEX) 10-8 MG/5ML suspension 5 mL (5 mLs Oral Given 01/18/19 0504)  vitamin C (ASCORBIC ACID) tablet 500 mg (500 mg Oral Given 01/19/19 0858)  zinc sulfate capsule 220 mg (220 mg Oral Given 01/19/19 0858)  acetaminophen (TYLENOL) tablet 650 mg (has no administration in time range)  remdesivir 200 mg in sodium chloride 0.9 % 250 mL IVPB (0 mg Intravenous Stopped 01/18/19 1151)    Followed by  remdesivir 100 mg in sodium chloride 0.9 % 250 mL IVPB (0 mg Intravenous Stopped 01/19/19 1056)  guaiFENesin-dextromethorphan (ROBITUSSIN DM) 100-10 MG/5ML syrup 10 mL (10 mLs Oral Given 01/19/19  1213)  albuterol (VENTOLIN HFA) 108 (90 Base) MCG/ACT inhaler 2 puff (2 puffs Inhalation Given 01/19/19 1639)  promethazine (PHENERGAN) injection 25 mg (25 mg Intravenous Given 01/18/19 1030)  dextrose 5 %-0.45 % sodium chloride infusion ( Intravenous Stopped 01/19/19 1420)  0.9 % NaCl with KCl 20 mEq/ L  infusion ( Intravenous New Bag/Given 01/19/19 1428)  ondansetron (ZOFRAN) injection 4 mg (4 mg Intravenous Given 01/18/19 0127)  potassium chloride SA (KLOR-CON) CR tablet 40 mEq (40 mEq Oral Given 01/18/19 0335)  ondansetron (ZOFRAN) injection 4 mg (4 mg Intravenous Given 01/18/19 0414)  potassium chloride SA (KLOR-CON) CR tablet 40 mEq (40 mEq Oral Given 01/19/19 0900)    Mobility walks Low fall risk

## 2019-01-20 DIAGNOSIS — I10 Essential (primary) hypertension: Secondary | ICD-10-CM

## 2019-01-20 MED ORDER — FAMOTIDINE 20 MG PO TABS
20.0000 mg | ORAL_TABLET | Freq: Two times a day (BID) | ORAL | Status: DC | PRN
  Administered 2019-01-22: 20 mg via ORAL
  Filled 2019-01-20: qty 1

## 2019-01-20 MED ORDER — POLYETHYLENE GLYCOL 3350 17 G PO PACK: 17.0000 g | PACK | Freq: Every day | ORAL | Status: DC | PRN

## 2019-01-20 MED ORDER — BISACODYL 5 MG PO TBEC
5.0000 mg | DELAYED_RELEASE_TABLET | Freq: Every day | ORAL | Status: DC | PRN
  Administered 2019-01-20: 5 mg via ORAL
  Filled 2019-01-20: qty 1

## 2019-01-20 NOTE — Progress Notes (Signed)
Pharmacy Note - Remdesivir Dosing  O:  ALT: 33 CXR:  patchy airspace opacities  Requiring supplemental O2: 97%on RA    A/P:  Patient meets criteria for remdesivir.  Begin remdesivir 200 mg IV x 1, followed by 100 mg IV daily x 4 days  Monitor ALT, clinical progress  Despina Pole, Pharm. D. Clinical Pharmacist 01/20/2019 2:56 AM

## 2019-01-20 NOTE — Progress Notes (Signed)
PROGRESS NOTE    Patient: Caroline Davis                            PCP: Hulan Fess, MD                    DOB: 30-Apr-1960            DOA: 01/17/2019 [id number]:[id number]             DOS: 01/20/2019, 1:00 PM   LOS: 2 days   Date of Service: The patient was seen and examined on 01/20/2019    Brief Narrative:   58 year old female with history of hypertension, GERD, IBS, depression presenting with cough shortness of breath nausea vomiting. 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  Returns from [place] with fever, body aches, nausea and vomiting.  Tested positive for Covid a week ago.  Symptoms progressively getting worse over 4 days.  Which includes cough, shortness of breath, nausea, vomiting.  Poor p.o. intake.  ED evaluation: SARS-CoV-2 positive O2 sat 92% on room air, afebrile no leukocytosis, sodium 3.0, troponin negative, EKG negative acute ischemic changes, x-ray bilateral patchy airspace opacity   Subjective:   Reports her dyspnea has improved, but still present, mainly exertional, as well she does report some cough, denies any chest pain .  Assessment & Plan:   Principal Problem:   Pneumonia due to COVID-19 virus Active Problems:   HTN (hypertension)   Acute respiratory failure with hypoxia (HCC)   Viral gastroenteritis   Hypokalemia   COVID-19 for pneumonia -Rapid Covid 19 test is positive -Remains on room air with no oxygen requirement -Measuring significant for multifocal opacity -Continue to trend inflammatory markers closely especially with CRP trending up -Was encouraged use incentive spirometry, flutter valve and to prone, to get out of bed to chair. -Continue with IV steroids -Continue with IV remdesivir -Continue with zinc and vitamin C  COVID-19 Labs  Recent Labs    01/18/19 0522 01/19/19 0453  DDIMER 0.45 0.48  FERRITIN 156  --   LDH 182 203*  CRP 4.9* 5.2*    No results found for: SARSCOV2NAA   Covid related  nausea/vomiting/dehydration  -Appears to be improving, continue with as needed nausea meds  Hypotensive/hypertension -Volume depletion, continue to hold antihypertensive regimen. -Proving, but blood pressure remains soft, continue to hold antihypertensive regimen including hydralazine and clonidine  Hypokalemia -Repleted   Depression -Continue home Effexor  Insomnia -Continue home Ambien as needed, melatonin   Cultures; 01/18/2019 blood cultures x2 >>>   Antimicrobials: No antibiotics at this time   DVT prophylaxis: [place] lovenox Code Status: Full Family Communication: D/W patient Disposition Plan:  Home  Consultants: None  Procedures:   No admission procedures for hospital encounter.   Antimicrobials:  Anti-infectives (From admission, onward)   Start     Dose/Rate Route Frequency Ordered Stop   01/19/19 1000  remdesivir 100 mg in sodium chloride 0.9 % 250 mL IVPB     100 mg 500 mL/hr over 30 Minutes Intravenous Every 24 hours 01/18/19 0622 01/23/19 0959   01/18/19 1000  lamiVUDine-zidovudine (COMBIVIR) 150-300 MG per tablet 1 tablet  Status:  Discontinued     1 tablet Oral 2 times daily 01/18/19 0712 01/19/19 1200   01/18/19 0730  remdesivir 200 mg in sodium chloride 0.9 % 250 mL IVPB     200 mg 500 mL/hr over 30 Minutes Intravenous Once 01/18/19  3149 01/18/19 1151       Medication:  . albuterol  2 puff Inhalation Q4H  . cloNIDine  0.2 mg Oral Daily  . dexamethasone (DECADRON) injection  6 mg Intravenous Q24H  . enoxaparin (LOVENOX) injection  40 mg Subcutaneous Daily  . guaiFENesin-dextromethorphan  10 mL Oral Q6H  . Melatonin  5 mg Oral QHS  . sodium chloride flush  3 mL Intravenous Once  . venlafaxine  75 mg Oral TID WC  . vitamin C  500 mg Oral Daily  . zinc sulfate  220 mg Oral Daily    acetaminophen, chlorpheniramine-HYDROcodone, ondansetron (ZOFRAN) IV, promethazine, zolpidem   Objective:   Vitals:   01/19/19 2000 01/20/19 0500 01/20/19  0743 01/20/19 1226  BP: (!) 168/88 99/71 110/76 107/63  Pulse:   61   Resp:   15   Temp: 98 F (36.7 C) 98.4 F (36.9 C) 97.7 F (36.5 C) 97.6 F (36.4 C)  TempSrc: Oral Oral Oral Oral  SpO2:   99%   Weight:      Height:        Intake/Output Summary (Last 24 hours) at 01/20/2019 1300 Last data filed at 01/20/2019 0300 Gross per 24 hour  Intake 240 ml  Output -  Net 240 ml   Filed Weights   01/18/19 0001 01/19/19 0715  Weight: 63.5 kg 73.5 kg     Examination:   Physical Exam  BP 107/63 (BP Location: Right Arm)   Pulse 61   Temp 97.6 F (36.4 C) (Oral)   Resp 15   Ht 5\' 10"  (1.778 m)   Wt 73.5 kg   SpO2 99%   BMI 23.24 kg/m   Awake Alert, Oriented X 3, No new F.N deficits, Normal affect Symmetrical Chest wall movement, Good air movement bilaterally, CTAB RRR,No Gallops,Rubs or new Murmurs, No Parasternal Heave +ve B.Sounds, Abd Soft, No tenderness, No rebound - guarding or rigidity. No Cyanosis, Clubbing or edema, No new Rash or bruise           LABs:  CBC Latest Ref Rng & Units 01/19/2019 01/18/2019  WBC 4.0 - 10.5 K/uL 3.8(L) 5.3  Hemoglobin 12.0 - 15.0 g/dL 01/20/2019 70.2  Hematocrit 63.7 - 46.0 % 36.2 36.8  Platelets 150 - 400 K/uL 238 283   CMP Latest Ref Rng & Units 01/19/2019 01/18/2019  Glucose 70 - 99 mg/dL 01/20/2019) 850(Y)  BUN 6 - 20 mg/dL 12 774(J)  Creatinine 28(N - 1.00 mg/dL 8.67 6.72  Sodium 0.94 - 145 mmol/L 139 140  Potassium 3.5 - 5.1 mmol/L 3.4(L) 3.0(L)  Chloride 98 - 111 mmol/L 103 103  CO2 22 - 32 mmol/L 25 24  Calcium 8.9 - 10.3 mg/dL 709) 9.0  Total Protein 6.5 - 8.1 g/dL 6.7 6.9  Total Bilirubin 0.3 - 1.2 mg/dL 0.5 0.5  Alkaline Phos 38 - 126 U/L 103 114  AST 15 - 41 U/L 36 33  ALT 0 - 44 U/L 33 31        Wai Minotti, MD,   If 7PM-7AM, please contact night-coverage [email or web address], Password [facility] 01/20/2019, 1:00 PM

## 2019-01-21 ENCOUNTER — Inpatient Hospital Stay (HOSPITAL_COMMUNITY)

## 2019-01-21 DIAGNOSIS — R1110 Vomiting, unspecified: Secondary | ICD-10-CM

## 2019-01-21 LAB — COMPREHENSIVE METABOLIC PANEL
ALT: 39 U/L (ref 0–44)
AST: 33 U/L (ref 15–41)
Albumin: 3.3 g/dL — ABNORMAL LOW (ref 3.5–5.0)
Alkaline Phosphatase: 103 U/L (ref 38–126)
Anion gap: 13 (ref 5–15)
BUN: 21 mg/dL — ABNORMAL HIGH (ref 6–20)
CO2: 23 mmol/L (ref 22–32)
Calcium: 9.3 mg/dL (ref 8.9–10.3)
Chloride: 102 mmol/L (ref 98–111)
Creatinine, Ser: 0.75 mg/dL (ref 0.44–1.00)
GFR calc Af Amer: >60 mL/min (ref >60)
GFR calc non Af Amer: >60 mL/min (ref >60)
Glucose, Bld: 94 mg/dL (ref 70–99)
Potassium: 3.5 mmol/L (ref 3.5–5.1)
Sodium: 138 mmol/L (ref 135–145)
Total Bilirubin: 0.5 mg/dL (ref 0.3–1.2)
Total Protein: 6.9 g/dL (ref 6.5–8.1)

## 2019-01-21 LAB — CBC WITH DIFFERENTIAL/PLATELET
Abs Immature Granulocytes: 0.1 10*3/uL — ABNORMAL HIGH (ref 0.00–0.07)
Basophils Absolute: 0 10*3/uL (ref 0.0–0.1)
Basophils Relative: 0 %
Eosinophils Absolute: 0 10*3/uL (ref 0.0–0.5)
Eosinophils Relative: 0 %
HCT: 36.7 % (ref 36.0–46.0)
Hemoglobin: 12.2 g/dL (ref 12.0–15.0)
Immature Granulocytes: 1 %
Lymphocytes Relative: 19 %
Lymphs Abs: 1.6 10*3/uL (ref 0.7–4.0)
MCH: 30.8 pg (ref 26.0–34.0)
MCHC: 33.2 g/dL (ref 30.0–36.0)
MCV: 92.7 fL (ref 80.0–100.0)
Monocytes Absolute: 0.6 10*3/uL (ref 0.1–1.0)
Monocytes Relative: 7 %
Neutro Abs: 6.5 10*3/uL (ref 1.7–7.7)
Neutrophils Relative %: 73 %
Platelets: 349 10*3/uL (ref 150–400)
RBC: 3.96 MIL/uL (ref 3.87–5.11)
RDW: 11.7 % (ref 11.5–15.5)
WBC: 8.8 10*3/uL (ref 4.0–10.5)
nRBC: 0 % (ref 0.0–0.2)

## 2019-01-21 LAB — LIPASE, BLOOD: Lipase: 22 U/L (ref 11–51)

## 2019-01-21 LAB — C-REACTIVE PROTEIN: CRP: 1.8 mg/dL — ABNORMAL HIGH (ref <1.0)

## 2019-01-21 LAB — TROPONIN I (HIGH SENSITIVITY)
Troponin I (High Sensitivity): <2 ng/L (ref <18)
Troponin I (High Sensitivity): <2 ng/L (ref <18)

## 2019-01-21 LAB — HIV ANTIBODY (ROUTINE TESTING W REFLEX): HIV Screen 4th Generation wRfx: NONREACTIVE

## 2019-01-21 LAB — D-DIMER, QUANTITATIVE: D-Dimer, Quant: 0.54 ug/mL-FEU — ABNORMAL HIGH (ref 0.00–0.50)

## 2019-01-21 MED ORDER — ALUM & MAG HYDROXIDE-SIMETH 200-200-20 MG/5ML PO SUSP
30.0000 mL | Freq: Once | ORAL | Status: AC
  Administered 2019-01-21: 30 mL via ORAL
  Filled 2019-01-21: qty 30

## 2019-01-21 MED ORDER — LIDOCAINE VISCOUS HCL 2 % MT SOLN
15.0000 mL | Freq: Once | OROMUCOSAL | Status: AC
  Administered 2019-01-21: 15 mL via ORAL
  Filled 2019-01-21: qty 15

## 2019-01-21 MED ORDER — NITROGLYCERIN 0.4 MG SL SUBL
SUBLINGUAL_TABLET | SUBLINGUAL | Status: AC
  Administered 2019-01-21: 01:00:00
  Filled 2019-01-21: qty 3

## 2019-01-21 MED ORDER — SENNOSIDES-DOCUSATE SODIUM 8.6-50 MG PO TABS
3.0000 | ORAL_TABLET | Freq: Two times a day (BID) | ORAL | Status: DC
  Administered 2019-01-21: 3 via ORAL
  Filled 2019-01-21 (×2): qty 3

## 2019-01-21 MED ORDER — NITROGLYCERIN 0.4 MG SL SUBL: 0.4000 mg | SUBLINGUAL_TABLET | SUBLINGUAL | Status: DC | PRN

## 2019-01-21 MED ORDER — SODIUM CHLORIDE 0.9 % IV SOLN: INTRAVENOUS | Status: DC | PRN

## 2019-01-21 MED ORDER — BISACODYL 5 MG PO TBEC
10.0000 mg | DELAYED_RELEASE_TABLET | Freq: Once | ORAL | Status: AC
  Administered 2019-01-21: 10 mg via ORAL
  Filled 2019-01-21: qty 2

## 2019-01-21 MED ORDER — PANTOPRAZOLE SODIUM 40 MG IV SOLR: 40.0000 mg | Freq: Every day | INTRAVENOUS | Status: DC

## 2019-01-21 MED ORDER — DICYCLOMINE HCL 10 MG/ML IM SOLN
20.0000 mg | Freq: Three times a day (TID) | INTRAMUSCULAR | Status: DC | PRN
  Administered 2019-01-21: 20 mg via INTRAMUSCULAR
  Filled 2019-01-21 (×3): qty 2

## 2019-01-21 MED ORDER — ONDANSETRON HCL 4 MG/2ML IJ SOLN: 4.0000 mg | Freq: Once | INTRAMUSCULAR | Status: DC

## 2019-01-21 MED ORDER — ONDANSETRON HCL 4 MG/2ML IJ SOLN
4.0000 mg | Freq: Once | INTRAMUSCULAR | Status: AC
  Administered 2019-01-21: 4 mg via INTRAVENOUS
  Filled 2019-01-21: qty 2

## 2019-01-21 MED ORDER — MORPHINE SULFATE (PF) 2 MG/ML IV SOLN
2.0000 mg | Freq: Once | INTRAVENOUS | Status: AC
  Administered 2019-01-21: 2 mg via INTRAVENOUS
  Filled 2019-01-21: qty 1

## 2019-01-21 MED ORDER — PANTOPRAZOLE SODIUM 40 MG IV SOLR
40.0000 mg | Freq: Once | INTRAVENOUS | Status: AC
  Administered 2019-01-21: 40 mg via INTRAVENOUS
  Filled 2019-01-21: qty 40

## 2019-01-21 MED ORDER — POLYETHYLENE GLYCOL 3350 17 G PO PACK
34.0000 g | PACK | Freq: Once | ORAL | Status: AC
  Administered 2019-01-21: 34 g via ORAL
  Filled 2019-01-21: qty 2

## 2019-01-21 MED ORDER — PANTOPRAZOLE SODIUM 40 MG IV SOLR
40.0000 mg | Freq: Every day | INTRAVENOUS | Status: DC
  Administered 2019-01-21: 40 mg via INTRAVENOUS
  Filled 2019-01-21 (×2): qty 40

## 2019-01-21 NOTE — Progress Notes (Signed)
On call provider (Dr. Vanita Ingles) notified of rapid response being called second to c/o chest pain and esophageal spasms.

## 2019-01-21 NOTE — Progress Notes (Signed)
Rapid Response Event Note  Overview: Called to PCU floor by RN on floor stating pt in 174 is a rapid response      Initial Focused Assessment: Pt vomiting, BP elevated. NSR 60's, Room air-O2 sats 90's. Pt nauseous and vomiting small amount of phlegm and bile colored substance. Pt stating it feels like a muscle spasm radiating from chest to both sides of jaw. Pain 8/10   Interventions:EKG showing NSR; CCMD confirming. QTC checked due to pt receiving Zofran and phenergan. QTC 0.48. 2L [place] placed on pt. BP cycled Q10 mins. BP remains stable.  2mg  Morphine given, 3 nitros given 5 mins apart. Chest pain down to 3/10. Pain still in Jaw mostly and still feels like a burning muscle spasm to pt.  Protonix IV 40mg  1x dose, GI cocktail 1x dose ordered Another EKG completed, Still NSR. No ectopy per CCMD New IV access placed. STAT labs sent off   Pt still in pain 3/10 after 60 mins of interventions. Pt still nauseous and vomiting small amount. VS stable.  Plan of Care (if not transferred): IV Protonix, GI cocktail, Zofran, KVO fluids, EKG 0500     Margaret Pyle

## 2019-01-21 NOTE — Progress Notes (Signed)
Pt. Arrived at 11 with her belongings. Pt. Stable. No further concerns or questions

## 2019-01-21 NOTE — Plan of Care (Signed)
  Problem: Education: Goal: Knowledge of risk factors and measures for prevention of condition will improve Outcome: Progressing   Problem: Coping: Goal: Psychosocial and spiritual needs will be supported Outcome: Progressing   Problem: Respiratory: Goal: Will maintain a patent airway Outcome: Progressing Goal: Complications related to the disease process, condition or treatment will be avoided or minimized Outcome: Progressing   

## 2019-01-21 NOTE — Progress Notes (Signed)
On call provider at bedside. Update given on patient condition. Second sublingual nitroglycerin administered. Patient seen vomiting into emesis basin.

## 2019-01-21 NOTE — Progress Notes (Signed)
In to assess pt and administer protonix at this time.  Pt states that she "threw up" her GI cocktail.  MD was in pt's room and is aware.  Protonix IV was administered.  Pt continues to vomit, but the severe pain from the esophogeal spasms did not return.  Pt now c/o a mild cramping feeling in her abdomen, that she feels is more that likely associated with the vomiting.  No other complaints voiced at this time.  Will continue to monitor.

## 2019-01-21 NOTE — Progress Notes (Signed)
PROGRESS NOTE    Patient: Caroline Davis                            PCP: Catha Gosselin, MD                    DOB: [birth year]            DOA: 01/17/2019 [id number]             DOS: 01/21/2019, 2:56 PM   LOS: 3 days   Date of Service: The patient was seen and examined on 01/21/2019    Brief Narrative:   58 year old female with history of hypertension, GERD, IBS, depression presenting with cough shortness of breath nausea vomiting. 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  Returns from [place] with fever, body aches, nausea and vomiting.  Tested positive for Covid a week ago.  Symptoms progressively getting worse over 4 days.  Which includes cough, shortness of breath, nausea, vomiting.  Poor p.o. intake.  ED evaluation: SARS-CoV-2 positive O2 sat 92% on room air, afebrile no leukocytosis, sodium 3.0, troponin negative, EKG negative acute ischemic changes, x-ray bilateral patchy airspace opacity   Subjective:   Reports had an episode of nausea/vomiting/GI/chest pain, she is still having nausea and vomiting during the day .  Assessment & Plan:   Principal Problem:   Pneumonia due to COVID-19 virus Active Problems:   HTN (hypertension)   Acute respiratory failure with hypoxia (HCC)   Viral gastroenteritis   Hypokalemia   COVID-19 for pneumonia -Rapid Covid 19 test is positive -Remains on room air with no oxygen requirement -Chest x-ray significant for multifocal opacity -Continue to trend inflammatory markers closely, CRP trending down which is reassuring -Was encouraged use incentive spirometry, flutter valve and to prone, to get out of bed to chair. -Continue with IV steroids -Continue with IV remdesivir -Continue with zinc and vitamin C  COVID-19 Labs  Recent Labs    01/19/19 0453 01/21/19 0010  DDIMER 0.48 0.54*  LDH 203*  --   CRP 5.2* 1.8*    No results found for: SARSCOV2NAA   Covid related  nausea/vomiting/dehydration  -With some initial improvement, but she is with significant nausea and vomiting today, KUB with no acute findings, continue with as needed Zofran and Phenergan for nausea and vomiting.  Hypotensive/hypertension -Volume depletion, continue to hold antihypertensive regimen. -Proving, but blood pressure remains soft, continue to hold antihypertensive regimen including hydralazine and clonidine  Hypokalemia -Repleted   Depression -Continue home Effexor  Insomnia -Continue home Ambien as needed, melatonin   DVT prophylaxis: [place] lovenox Code Status: Full Family Communication: D/W patient Disposition Plan:  Home  Consultants: None  Procedures:   No admission procedures for hospital encounter.   Antimicrobials:  Anti-infectives (From admission, onward)   Start     Dose/Rate Route Frequency Ordered Stop   01/19/19 1000  remdesivir 100 mg in sodium chloride 0.9 % 250 mL IVPB     100 mg 500 mL/hr over 30 Minutes Intravenous Every 24 hours 01/18/19 0622 01/23/19 0959   01/18/19 1000  lamiVUDine-zidovudine (COMBIVIR) 150-300 MG per tablet 1 tablet  Status:  Discontinued     1 tablet Oral 2 times daily 01/18/19 0712 01/19/19 1200   01/18/19 0730  remdesivir 200 mg in sodium chloride 0.9 % 250 mL IVPB     200 mg 500 mL/hr over 30 Minutes Intravenous Once 01/18/19 0622 01/18/19 1151  Medication:  . albuterol  2 puff Inhalation Q4H  . dexamethasone (DECADRON) injection  6 mg Intravenous Q24H  . enoxaparin (LOVENOX) injection  40 mg Subcutaneous Daily  . guaiFENesin-dextromethorphan  10 mL Oral Q6H  . Melatonin  5 mg Oral QHS  . pantoprazole (PROTONIX) IV  40 mg Intravenous QHS  . senna-docusate  3 tablet Oral BID  . sodium chloride flush  3 mL Intravenous Once  . venlafaxine  75 mg Oral TID WC  . vitamin C  500 mg Oral Daily  . zinc sulfate  220 mg Oral Daily    sodium chloride, acetaminophen, bisacodyl, chlorpheniramine-HYDROcodone,  dicyclomine, famotidine, nitroGLYCERIN, ondansetron (ZOFRAN) IV, polyethylene glycol, promethazine, zolpidem   Objective:   Vitals:   01/21/19 0111 01/21/19 0447 01/21/19 0836 01/21/19 1227  BP: 122/81 (!) 142/92 138/75 (!) 148/85  Pulse:  69  74  Resp:  12  16  Temp:  98.1 F (36.7 C) 98 F (36.7 C) 99.7 F (37.6 C)  TempSrc:  Oral Oral Oral  SpO2:  100%  100%  Weight:      Height:        Intake/Output Summary (Last 24 hours) at 01/21/2019 1456 Last data filed at 01/21/2019 1400 Gross per 24 hour  Intake 461.9 ml  Output 202 ml  Net 259.9 ml   Filed Weights   01/18/19 0001 01/19/19 0715  Weight: 63.5 kg 73.5 kg     Examination:   Physical Exam  BP (!) 148/85   Pulse 74   Temp 99.7 F (37.6 C) (Oral)   Resp 16   Ht 5\' 10"  (1.778 m)   Wt 73.5 kg   SpO2 100%   BMI 23.24 kg/m   Awake Alert, Oriented X 3, No new F.N deficits, Normal affect Symmetrical Chest wall movement, Good air movement bilaterally, CTAB RRR,No Gallops,Rubs or new Murmurs, No Parasternal Heave +ve B.Sounds, Abd Soft, No tenderness, No rebound - guarding or rigidity. No Cyanosis, Clubbing or edema, No new Rash or bruise            LABs:  CBC Latest Ref Rng & Units 01/21/2019 01/19/2019 01/18/2019  WBC 4.0 - 10.5 K/uL 8.8 3.8(L) 5.3  Hemoglobin 12.0 - 15.0 g/dL 12.2 12.0 12.5  Hematocrit 36.0 - 46.0 % 36.7 36.2 36.8  Platelets 150 - 400 K/uL 349 238 283   CMP Latest Ref Rng & Units 01/21/2019 01/19/2019 01/18/2019  Glucose 70 - 99 mg/dL 94 117(H) 123(H)  BUN 6 - 20 mg/dL 21(H) 12 21(H)  Creatinine 0.44 - 1.00 mg/dL 0.75 0.57 0.79  Sodium 135 - 145 mmol/L 138 139 140  Potassium 3.5 - 5.1 mmol/L 3.5 3.4(L) 3.0(L)  Chloride 98 - 111 mmol/L 102 103 103  CO2 22 - 32 mmol/L 23 25 24   Calcium 8.9 - 10.3 mg/dL 9.3 8.6(L) 9.0  Total Protein 6.5 - 8.1 g/dL 6.9 6.7 6.9  Total Bilirubin 0.3 - 1.2 mg/dL 0.5 0.5 0.5  Alkaline Phos 38 - 126 U/L 103 103 114  AST 15 - 41 U/L 33 36 33  ALT  0 - 44 U/L 39 7 31        Dawood Elgergawy, MD,   If 7PM-7AM, please contact night-coverage [email or web address] 01/21/2019, 2:56 PM

## 2019-01-21 NOTE — Progress Notes (Signed)
Notified Dr Waldron Labs that pt was still having persistent nausea/vomiting after 4 mg zofran & 25 mg phenergan.  KUB reading was good per radiology.  MD ordered for 1 more dose of 4mg  zofran.

## 2019-01-21 NOTE — Progress Notes (Signed)
Pt continuing to c/o mild chest pain of 3-4/10.  No nausea or vomiting noted at this time.  GI cocktail was administered per MD orders.  Will continue to monitor.

## 2019-01-21 NOTE — Significant Event (Signed)
Rapid response was called to the patient bedside for chest pain, nausea.  Patient presented with several days of nausea with vomiting prior to admission.  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  Pain was colicky in nature, reported as "esophageal spasm" by the patient.  She reports she exercises regularly without development of chest discomfort or inappropriate shortness of breath.  She is a non-smoker.  She has HTN however denies HLD or DM.  No family history of CAD.  Marginal relief from sublingual nitroglycerin.  He vomited some of the GI cocktail however reported significant relief. -Likely irritation from a prolonged nausea with vomiting -She is on Protonix at home which she has not received here, will start her on PPI IV twice daily -She may benefit from gastroenterology follow-up as outpatient for EGD -She may benefit from cardiology follow-up as outpatient for stress testing

## 2019-01-21 NOTE — Progress Notes (Signed)
Third 0.4 mg NTG given sublingual. EKG obtained at bedside. Vital signs are as follows: 122/81, HR 69, MAP 94, O2 saturation of 100 on 2 liters nasal canula and RR 13

## 2019-01-21 NOTE — Progress Notes (Signed)
Nursing rounds completed. Patient found sitting along bedside vomiting in emesis bag. States that her chest pain is 10/10 with esophageal spasms noted. Vital sign obtained as follows: BP 181/123, HR 66, O2 10,RR 13.

## 2019-01-22 LAB — COMPREHENSIVE METABOLIC PANEL
ALT: 32 U/L (ref 0–44)
AST: 26 U/L (ref 15–41)
Albumin: 3.2 g/dL — ABNORMAL LOW (ref 3.5–5.0)
Alkaline Phosphatase: 93 U/L (ref 38–126)
Anion gap: 10 (ref 5–15)
BUN: 24 mg/dL — ABNORMAL HIGH (ref 6–20)
CO2: 24 mmol/L (ref 22–32)
Calcium: 8.9 mg/dL (ref 8.9–10.3)
Chloride: 106 mmol/L (ref 98–111)
Creatinine, Ser: 0.71 mg/dL (ref 0.44–1.00)
GFR calc Af Amer: >60 mL/min (ref >60)
GFR calc non Af Amer: >60 mL/min (ref >60)
Glucose, Bld: 81 mg/dL (ref 70–99)
Potassium: 3.2 mmol/L — ABNORMAL LOW (ref 3.5–5.1)
Sodium: 140 mmol/L (ref 135–145)
Total Bilirubin: 0.6 mg/dL (ref 0.3–1.2)
Total Protein: 6.3 g/dL — ABNORMAL LOW (ref 6.5–8.1)

## 2019-01-22 LAB — CBC WITH DIFFERENTIAL/PLATELET
Abs Immature Granulocytes: 0.07 10*3/uL (ref 0.00–0.07)
Basophils Absolute: 0 10*3/uL (ref 0.0–0.1)
Basophils Relative: 0 %
Eosinophils Absolute: 0 10*3/uL (ref 0.0–0.5)
Eosinophils Relative: 0 %
HCT: 35 % — ABNORMAL LOW (ref 36.0–46.0)
Hemoglobin: 11.7 g/dL — ABNORMAL LOW (ref 12.0–15.0)
Immature Granulocytes: 1 %
Lymphocytes Relative: 31 %
Lymphs Abs: 2.3 10*3/uL (ref 0.7–4.0)
MCH: 31 pg (ref 26.0–34.0)
MCHC: 33.4 g/dL (ref 30.0–36.0)
MCV: 92.6 fL (ref 80.0–100.0)
Monocytes Absolute: 0.6 10*3/uL (ref 0.1–1.0)
Monocytes Relative: 9 %
Neutro Abs: 4.3 10*3/uL (ref 1.7–7.7)
Neutrophils Relative %: 59 %
Platelets: 381 10*3/uL (ref 150–400)
RBC: 3.78 MIL/uL — ABNORMAL LOW (ref 3.87–5.11)
RDW: 11.7 % (ref 11.5–15.5)
WBC: 7.3 10*3/uL (ref 4.0–10.5)
nRBC: 0 % (ref 0.0–0.2)

## 2019-01-22 LAB — C-REACTIVE PROTEIN: CRP: 2.2 mg/dL — ABNORMAL HIGH (ref <1.0)

## 2019-01-22 LAB — D-DIMER, QUANTITATIVE: D-Dimer, Quant: 1.13 ug/mL-FEU — ABNORMAL HIGH (ref 0.00–0.50)

## 2019-01-22 MED ORDER — DEXAMETHASONE 6 MG PO TABS: 6.0000 mg | ORAL_TABLET | Freq: Every day | ORAL | 0 refills | Status: AC

## 2019-01-22 MED ORDER — PANTOPRAZOLE SODIUM 20 MG PO TBEC: 20.0000 mg | DELAYED_RELEASE_TABLET | Freq: Every day | ORAL | 0 refills | Status: AC

## 2019-01-22 MED ORDER — ESTRADIOL 0.5 MG PO TABS: 0.5000 mg | ORAL_TABLET | Freq: Every day | ORAL | Status: AC

## 2019-01-22 MED ORDER — POTASSIUM CHLORIDE CRYS ER 20 MEQ PO TBCR
40.0000 meq | EXTENDED_RELEASE_TABLET | Freq: Once | ORAL | Status: AC
  Administered 2019-01-22: 40 meq via ORAL
  Filled 2019-01-22: qty 2

## 2019-01-22 MED ORDER — IRBESARTAN 300 MG PO TABS: 150.0000 mg | ORAL_TABLET | Freq: Every day | ORAL | Status: AC

## 2019-01-22 NOTE — Discharge Instructions (Signed)
Person Under Monitoring Name: Caroline Davis  Location: [address] [place] [address]   Infection Prevention Recommendations for Individuals Confirmed to have, or Being Evaluated for, 2019 Novel Coronavirus (COVID-19) Infection Who Receive Care at Home  Individuals who are confirmed to have, or are being evaluated for, COVID-19 should follow the prevention steps below until a healthcare provider or local or state health department says they can return to normal activities.  Stay home except to get medical care You should restrict activities outside your home, except for getting medical care. Do not go to work, school, or public areas, and do not use public transportation or taxis.  Call ahead before visiting your doctor Before your medical appointment, call the healthcare provider and tell them that you have, or are being evaluated for, COVID-19 infection. This will help the healthcare providers office take steps to keep other people from getting infected. Ask your healthcare provider to call the local or state health department.  Monitor your symptoms Seek prompt medical attention if your illness is worsening (e.g., difficulty breathing). Before going to your medical appointment, call the healthcare provider and tell them that you have, or are being evaluated for, COVID-19 infection. Ask your healthcare provider to call the local or state health department.  Wear a facemask You should wear a facemask that covers your nose and mouth when you are in the same room with other people and when you visit a healthcare provider. People who live with or visit you should also wear a facemask while they are in the same room with you.  Separate yourself from other people in your home As much as possible, you should stay in a different room from other people in your home. Also, you should use a separate bathroom, if available.  Avoid sharing household items You should not share  dishes, drinking glasses, cups, eating utensils, towels, bedding, or other items with other people in your home. After using these items, you should wash them thoroughly with soap and water.  Cover your coughs and sneezes Cover your mouth and nose with a tissue when you cough or sneeze, or you can cough or sneeze into your sleeve. Throw used tissues in a lined trash can, and immediately wash your hands with soap and water for at least 20 seconds or use an alcohol-based hand rub.  Wash your [facility] your hands often and thoroughly with soap and water for at least 20 seconds. You can use an alcohol-based hand sanitizer if soap and water are not available and if your hands are not visibly dirty. Avoid touching your eyes, nose, and mouth with unwashed hands.   Prevention Steps for Caregivers and Household Members of Individuals Confirmed to have, or Being Evaluated for, COVID-19 Infection Being Cared for in the Home  If you live with, or provide care at home for, a person confirmed to have, or being evaluated for, COVID-19 infection please follow these guidelines to prevent infection:  Follow healthcare providers instructions Make sure that you understand and can help the patient follow any healthcare provider instructions for all care.  Provide for the patients basic needs You should help the patient with basic needs in the home and provide support for getting groceries, prescriptions, and other personal needs.  Monitor the patients symptoms If they are getting sicker, call his or her medical provider and tell them that the patient has, or is being evaluated for, COVID-19 infection. This will help the healthcare providers office  take steps to keep other people from getting infected. Ask the healthcare provider to call the local or state health department.  Limit the number of people who have contact with the patient  If possible, have only one caregiver for the patient.  Other  household members should stay in another home or place of residence. If this is not possible, they should stay  in another room, or be separated from the patient as much as possible. Use a separate bathroom, if available.  Restrict visitors who do not have an essential need to be in the home.  Keep older adults, very young children, and other sick people away from the patient Keep older adults, very young children, and those who have compromised immune systems or chronic health conditions away from the patient. This includes people with chronic heart, lung, or kidney conditions, diabetes, and cancer.  Ensure good ventilation Make sure that shared spaces in the home have good air flow, such as from an air conditioner or an opened window, weather permitting.  Wash your hands often  Wash your hands often and thoroughly with soap and water for at least 20 seconds. You can use an alcohol based hand sanitizer if soap and water are not available and if your hands are not visibly dirty.  Avoid touching your eyes, nose, and mouth with unwashed hands.  Use disposable paper towels to dry your hands. If not available, use dedicated cloth towels and replace them when they become wet.  Wear a facemask and gloves  Wear a disposable facemask at all times in the room and gloves when you touch or have contact with the patients blood, body fluids, and/or secretions or excretions, such as sweat, saliva, sputum, nasal mucus, vomit, urine, or feces.  Ensure the mask fits over your nose and mouth tightly, and do not touch it during use.  Throw out disposable facemasks and gloves after using them. Do not reuse.  Wash your hands immediately after removing your facemask and gloves.  If your personal clothing becomes contaminated, carefully remove clothing and launder. Wash your hands after handling contaminated clothing.  Place all used disposable facemasks, gloves, and other waste in a lined container before  disposing them with other household waste.  Remove gloves and wash your hands immediately after handling these items.  Do not share dishes, glasses, or other household items with the patient  Avoid sharing household items. You should not share dishes, drinking glasses, cups, eating utensils, towels, bedding, or other items with a patient who is confirmed to have, or being evaluated for, COVID-19 infection.  After the person uses these items, you should wash them thoroughly with soap and water.  Wash laundry thoroughly  Immediately remove and wash clothes or bedding that have blood, body fluids, and/or secretions or excretions, such as sweat, saliva, sputum, nasal mucus, vomit, urine, or feces, on them.  Wear gloves when handling laundry from the patient.  Read and follow directions on labels of laundry or clothing items and detergent. In general, wash and dry with the warmest temperatures recommended on the label.  Clean all areas the individual has used often  Clean all touchable surfaces, such as counters, tabletops, doorknobs, bathroom fixtures, toilets, phones, keyboards, tablets, and bedside tables, every day. Also, clean any surfaces that may have blood, body fluids, and/or secretions or excretions on them.  Wear gloves when cleaning surfaces the patient has come in contact with.  Use a diluted bleach solution (e.g., dilute bleach with 1 part  bleach and 10 parts water) or a household disinfectant with a label that says EPA-registered for coronaviruses. To make a bleach solution at home, add 1 tablespoon of bleach to 1 quart (4 cups) of water. For a larger supply, add  cup of bleach to 1 gallon (16 cups) of water.  Read labels of cleaning products and follow recommendations provided on product labels. Labels contain instructions for safe and effective use of the cleaning product including precautions you should take when applying the product, such as wearing gloves or eye protection  and making sure you have good ventilation during use of the product.  Remove gloves and wash hands immediately after cleaning.  Monitor yourself for signs and symptoms of illness Caregivers and household members are considered close contacts, should monitor their health, and will be asked to limit movement outside of the home to the extent possible. Follow the monitoring steps for close contacts listed on the symptom monitoring form.   ? If you have additional questions, contact your local health department or call the epidemiologist on call at [phone] (available 24/7). ? This guidance is subject to change. For the most up-to-date guidance from [facility], please refer to their website: [email or web address]

## 2019-01-22 NOTE — Discharge Summary (Signed)
Caroline Davis, is a 58 y.o. female  DOB 09/12/1960  MRN [id number].  Admission date:  01/17/2019  Admitting Physician  John GiovanniVasundhra Rathore, MD  Discharge Date:  01/22/2019   Primary MD  Catha GosselinLittle, Kevin, MD  Recommendations for primary care physician for things to follow:  -Check CBC, CMP during next visit -Adjust antihypertensive regimen if indicated, chlorthalidone has been stopped,   Admission Diagnosis  COVID-19 [U07.1]   Discharge Diagnosis  COVID-19 [U07.1]    Principal Problem:   Pneumonia due to COVID-19 virus Active Problems:   HTN (hypertension)   Acute respiratory failure with hypoxia (HCC)   Viral gastroenteritis   Hypokalemia      Past Medical History:  Diagnosis Date  . Allergic rhinitis   . Depression   . Edema   . GERD (gastroesophageal reflux disease)   . Headache   . HTN (hypertension)   . Hypoglycemia   . IBS (irritable bowel syndrome)   . Insomnia   . Rectocele     Past Surgical History:  Procedure Laterality Date  . APPENDECTOMY    . CHOLECYSTECTOMY  2003  . FEMUR FRACTURE SURGERY  1994  . hysterectomy, bso, endometriosis    . left breast needle biopsy  2016   benign findings       History of present illness and  Hospital Course:     Kindly see H&P for history of present illness and admission details, please review complete Labs, Consult reports and Test reports for all details in brief  HPI  from the history and physical done on the day of admission 01/18/2019  HPI: Caroline Davis is a 58 y.o. female with medical history significant of hypertension, GERD, IBS, depression presenting to the ED with complaints of cough and shortness of breath.  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  After returning from [place] she started having fevers and body aches.  She tested  positive for Covid a week ago.  For the past 4 days she is having cough, shortness of breath, nausea, and vomiting.  She has not been able to eat much.  ED Course: Rapid SARS-CoV-2 test positive.  Oxygen saturation as low as 92% at rest.  Afebrile and no leukocytosis.  Potassium 3.0.  High-sensitivity troponin negative.  EKG without acute ischemic changes.  Chest x-ray showing bilateral patchy airspace opacities.   Hospital Course   COVID-19 for pneumonia -Rapid Covid 19 test is positive -Remains on room air with no oxygen requirement -Chest x-ray significant for multifocal opacity -She was treated with IV remdesivir x5 days, kept on IV Decadron, she will be discharged on another 5 days of oral Decadron for total of 10 days treatment. -Inflammatory markers trending down which is reassuring  COVID-19 Labs  Recent Labs (last 2 labs)       Recent Labs    01/19/19 0453 01/21/19 0010  DDIMER 0.48 0.54*  LDH 203*  --   CRP  5.2* 1.8*      Covid related nausea/vomiting/dehydration  -She has intermittent nausea and vomiting during hospital stay, KUB with no acute findings, this has resolved, with no nausea or vomiting overnight.  Hypotensive/hypertension -Patient blood pressure is soft initially on admission, so all her medication has been held, started to increase by time of discharge, so she will be resumed on clonidine, and hydralazine, and will be instructed to resume irbesartan in 3 days, and to discontinue chlorthalidone.  Hypokalemia -Repleted   Depression -Continue home Effexor    Discharge Condition:  stable     Discharge Instructions  and  Discharge Medications    Discharge Instructions    Discharge instructions   Complete by: As directed    Follow with Primary MD Catha Gosselin, MD  Get CBC, CMP,  checked  by Primary MD next visit.    Activity: As tolerated with Full fall precautions use walker/cane & assistance as needed   Disposition Home     Diet: Heart Healthy  .   On your next visit with your primary care physician please Get Medicines reviewed and adjusted.   Please request your Prim.MD to go over all Hospital Tests and Procedure/Radiological results at the follow up, please get all Hospital records sent to your Prim MD by signing hospital release before you go home.   If you experience worsening of your admission symptoms, develop shortness of breath, life threatening emergency, suicidal or homicidal thoughts you must seek medical attention immediately by calling 911 or calling your MD immediately  if symptoms less severe.  You Must read complete instructions/literature along with all the possible adverse reactions/side effects for all the Medicines you take and that have been prescribed to you. Take any new Medicines after you have completely understood and accpet all the possible adverse reactions/side effects.   Do not drive, operating heavy machinery, perform activities at heights, swimming or participation in water activities or provide baby sitting services if your were admitted for syncope or siezures until you have seen by Primary MD or a Neurologist and advised to do so again.  Do not drive when taking Pain medications.    Do not take more than prescribed Pain, Sleep and Anxiety Medications  Special Instructions: If you have smoked or chewed Tobacco  in the last 2 yrs please stop smoking, stop any regular Alcohol  and or any Recreational drug use.  Wear Seat belts while driving.   Please note  You were cared for by a hospitalist during your hospital stay. If you have any questions about your discharge medications or the care you received while you were in the hospital after you are discharged, you can call the unit and asked to speak with the hospitalist on call if the hospitalist that took care of you is not available. Once you are discharged, your primary care physician will handle any further medical issues.  Please note that NO REFILLS for any discharge medications will be authorized once you are discharged, as it is imperative that you return to your primary care physician (or establish a relationship with a primary care physician if you do not have one) for your aftercare needs so that they can reassess your need for medications and monitor your lab values.   Increase activity slowly   Complete by: As directed      Allergies as of 01/22/2019      Reactions   Beta Adrenergic Blockers    DEPRESSION   Amlodipine  EDEMA   Aripiprazole    woozy   Risperidone And Related    suicidal      Medication List    STOP taking these medications   chlorthalidone 25 MG tablet Commonly known as: HYGROTON     TAKE these medications   cloNIDine 0.2 MG tablet Commonly known as: CATAPRES Take 0.2-0.4 mg by mouth See admin instructions. 0.2 mg every morning, and 0.4 mg every night   dexamethasone 6 MG tablet Commonly known as: DECADRON Take 1 tablet (6 mg total) by mouth daily. Start taking on: January 23, 2019   estradiol 0.5 MG tablet Commonly known as: ESTRACE Take 1 tablet (0.5 mg total) by mouth daily after breakfast. Hold for 1 month Start taking on: February 21, 2019 What changed:   additional instructions  These instructions start on February 21, 2019. If you are unsure what to do until then, ask your doctor or other care provider.   hydrALAZINE 50 MG tablet Commonly known as: APRESOLINE Take 1 tablet (50 mg total) by mouth 2 (two) times daily. What changed:   how much to take  when to take this   irbesartan 300 MG tablet Commonly known as: AVAPRO Take 0.5 tablets (150 mg total) by mouth at bedtime. Resume in 4 days Start taking on: January 26, 2019 What changed:   how much to take  when to take this  additional instructions  These instructions start on January 26, 2019. If you are unsure what to do until then, ask your doctor or other care provider.   Melatonin 5  MG Caps Take 5 mg by mouth at bedtime.   MULTIVITAMIN ADULT PO Take 1 tablet by mouth daily after breakfast.   OVER THE COUNTER MEDICATION Take 2 capsules by mouth at bedtime. Mag L Theonate 2000   pantoprazole 20 MG tablet Commonly known as: Protonix Take 1 tablet (20 mg total) by mouth daily.   venlafaxine 75 MG tablet Commonly known as: EFFEXOR Take 1 tablet (75 mg total) by mouth 3 (three) times daily with meals.   zolpidem 10 MG tablet Commonly known as: AMBIEN 1/2 to 1 tablet at night as needed for sleep What changed:   how much to take  how to take this  when to take this  additional instructions         Diet and Activity recommendation: See Discharge Instructions above   Consults obtained -  None   Major procedures and Radiology Reports - PLEASE review detailed and final reports for all details, in brief -     Dg Chest 2 View  Result Date: 01/18/2019 CLINICAL DATA:  Recent COVID exposure several days ago with shortness of breath EXAM: CHEST - 2 VIEW COMPARISON:  05/25/2017 FINDINGS: Cardiac shadows within normal limits. The lungs are well aerated bilaterally. Patchy airspace opacities are noted left greater than right consistent with the given clinical history of recent COVID-19 exposure. No sizable effusion is noted. No bony abnormality is noted. IMPRESSION: Patchy airspace opacities consistent with the given clinical history of recent COVID-19 exposure. Electronically Signed   By: Alcide Clever M.D.   On: 01/18/2019 00:37   Dg Abd Portable 1v  Result Date: 01/21/2019 CLINICAL DATA:  Vomiting. EXAM: PORTABLE ABDOMEN - 1 VIEW COMPARISON:  None. FINDINGS: Surgical clips over the right upper quadrant. Bowel gas pattern is nonobstructive with mild fecal retention throughout the colon. No free peritoneal air. Bones and soft tissues are otherwise unremarkable. IMPRESSION: Nonobstructive bowel gas pattern. Electronically Signed  By: Marin Olp M.D.   On:  01/21/2019 11:53   US Renal Artery Duplex Complete  Result Date: 12/31/2018 CLINICAL DATA:  58 year old female with hypertension EXAM: RENAL/URINARY TRACT ULTRASOUND RENAL DUPLEX DOPPLER ULTRASOUND COMPARISON:  None. FINDINGS: Right Kidney: Length: 10.2 cm. Echogenicity within normal limits. No mass or hydronephrosis visualized. Left Kidney: Length: 10.2 cm. Echogenicity within normal limits. No mass or hydronephrosis visualized. Bladder:  Within normal limits for the degree of distension. RENAL DUPLEX ULTRASOUND Right Renal Artery Velocities: Origin:  86, 72 cm/sec Mid:  109, 88 cm/sec Hilum:  110, 101 cm/sec Interlobar:  55 cm/sec Arcuate:  26 cm/sec Left Renal Artery Velocities: Origin:  130, 130 cm/sec Mid:  183, 132 cm/sec Hilum:  83, 84 cm/sec Interlobar:  60 cm/sec Arcuate:  30 cm/sec Aortic Velocity:  114 cm/sec Right Renal-Aortic Ratios: Origin: 0.6, 0.6 Mid:  1, 0.8 Hilum: 1, 0.9 Interlobar: 0.5 Arcuate: 0.2 Left Renal-Aortic Ratios: Origin: 1.1, 1.1 Mid: 1.6, 1.1 Hilum: 0.7, 0.7 Interlobar: 0.5 Arcuate: 0.3 IMPRESSION: 1. No evidence for renal artery stenosis. 2. There are 2 renal arteries bilaterally. 3. Normal sonographic appearance of the kidneys. Signed, Criselda Peaches, MD, [facility] Electronically Signed   By: Jacqulynn Cadet M.D.   On: 12/31/2018 11:02    Micro Results    Recent Results (from the past 240 hour(s))  Culture, blood (routine x 2)     Status: None (Preliminary result)   Collection Time: 01/18/19  9:50 AM   Specimen: BLOOD LEFT HAND  Result Value Ref Range Status   Specimen Description   Final    BLOOD LEFT HAND Performed at [facility] [address] [place], [place] [address]    Special Requests   Final    BOTTLES DRAWN AEROBIC ONLY Blood Culture adequate volume Performed at [facility] [address], [place], [place] [address]    Culture   Final     NO GROWTH 3 DAYS Performed at [facility], [place] [address], [place], [place] [address]    Report Status PENDING  Incomplete  Culture, blood (routine x 2)     Status: None (Preliminary result)   Collection Time: 01/18/19  9:50 AM   Specimen: BLOOD LEFT HAND  Result Value Ref Range Status   Specimen Description   Final    BLOOD LEFT HAND Performed at [facility] [address], [place], [place] [address]    Special Requests   Final    BOTTLES DRAWN AEROBIC ONLY Blood Culture adequate volume Performed at [facility] [address], [place], [place] [address]    Culture   Final    NO GROWTH 3 DAYS Performed at [facility], [place] [address] [place],  [address]    Report Status PENDING  Incomplete       Today   Subjective:   Kerin Roark today has no headache,no chest or abdominal pain, tolerating oral intake very well, no nausea or vomiting overnight, no new weakness tingling or numbness, feels much better wants to go home today.   Objective:   Blood pressure 137/83, pulse 66, temperature 98.2 F (36.8 C), temperature source Oral, resp. rate 18, height 5\' 10"  (1.778 m), weight 73.5 kg, SpO2 98 %.   Intake/Output Summary (Last 24 hours) at 01/22/2019 1038 Last data filed at 01/21/2019 1844 Gross per 24 hour  Intake 350 ml  Output 201 ml  Net 149 ml    Exam Awake Alert, Oriented x 3, No new F.N deficits, Normal affect Symmetrical Chest wall movement, Good air movement bilaterally, CTAB RRR,No Gallops,Rubs or new Murmurs, No Parasternal Heave  Abd Soft, Non tender No Cyanosis, Clubbing or edema  Data Review   CBC w Diff:  Lab Results  Component Value Date   WBC 7.3 01/22/2019   HGB 11.7 (L) 01/22/2019   HCT 35.0 (L) 01/22/2019   PLT 381 01/22/2019   LYMPHOPCT 31 01/22/2019   MONOPCT 9 01/22/2019   EOSPCT 0 01/22/2019   BASOPCT 0 01/22/2019    CMP:  Lab Results  Component Value Date   NA  140 01/22/2019   K 3.2 (L) 01/22/2019   CL 106 01/22/2019   CO2 24 01/22/2019   BUN 24 (H) 01/22/2019   CREATININE 0.71 01/22/2019   PROT 6.3 (L) 01/22/2019   ALBUMIN 3.2 (L) 01/22/2019   BILITOT 0.6 01/22/2019   ALKPHOS 93 01/22/2019   AST 26 01/22/2019   ALT 32 01/22/2019  .   Total Time in preparing paper work, data evaluation and todays exam - 35 minutes  Huey Bienenstock M.D on 01/22/2019 at 10:38 AM  [facility]   Office  [phone]

## 2019-01-23 LAB — CULTURE, BLOOD (ROUTINE X 2)
Culture: NO GROWTH
Culture: NO GROWTH
Special Requests: ADEQUATE
Special Requests: ADEQUATE

## 2019-02-08 ENCOUNTER — Encounter

## 2019-03-01 ENCOUNTER — Other Ambulatory Visit: Payer: Self-pay

## 2019-04-10 ENCOUNTER — Other Ambulatory Visit: Payer: Self-pay | Admitting: Internal Medicine

## 2019-04-15 ENCOUNTER — Other Ambulatory Visit: Payer: Self-pay | Admitting: Psychiatry

## 2021-07-03 IMAGING — DX DG ABD PORTABLE 1V
1 series · 2 of 2 positions shown · non-contrast
Comparison: None.

CLINICAL DATA: Vomiting.

EXAM:
PORTABLE ABDOMEN - 1 VIEW

[Series 1: abdomen · 0.14mm/px · 2 of 2 slices shown]
[im 1/2]
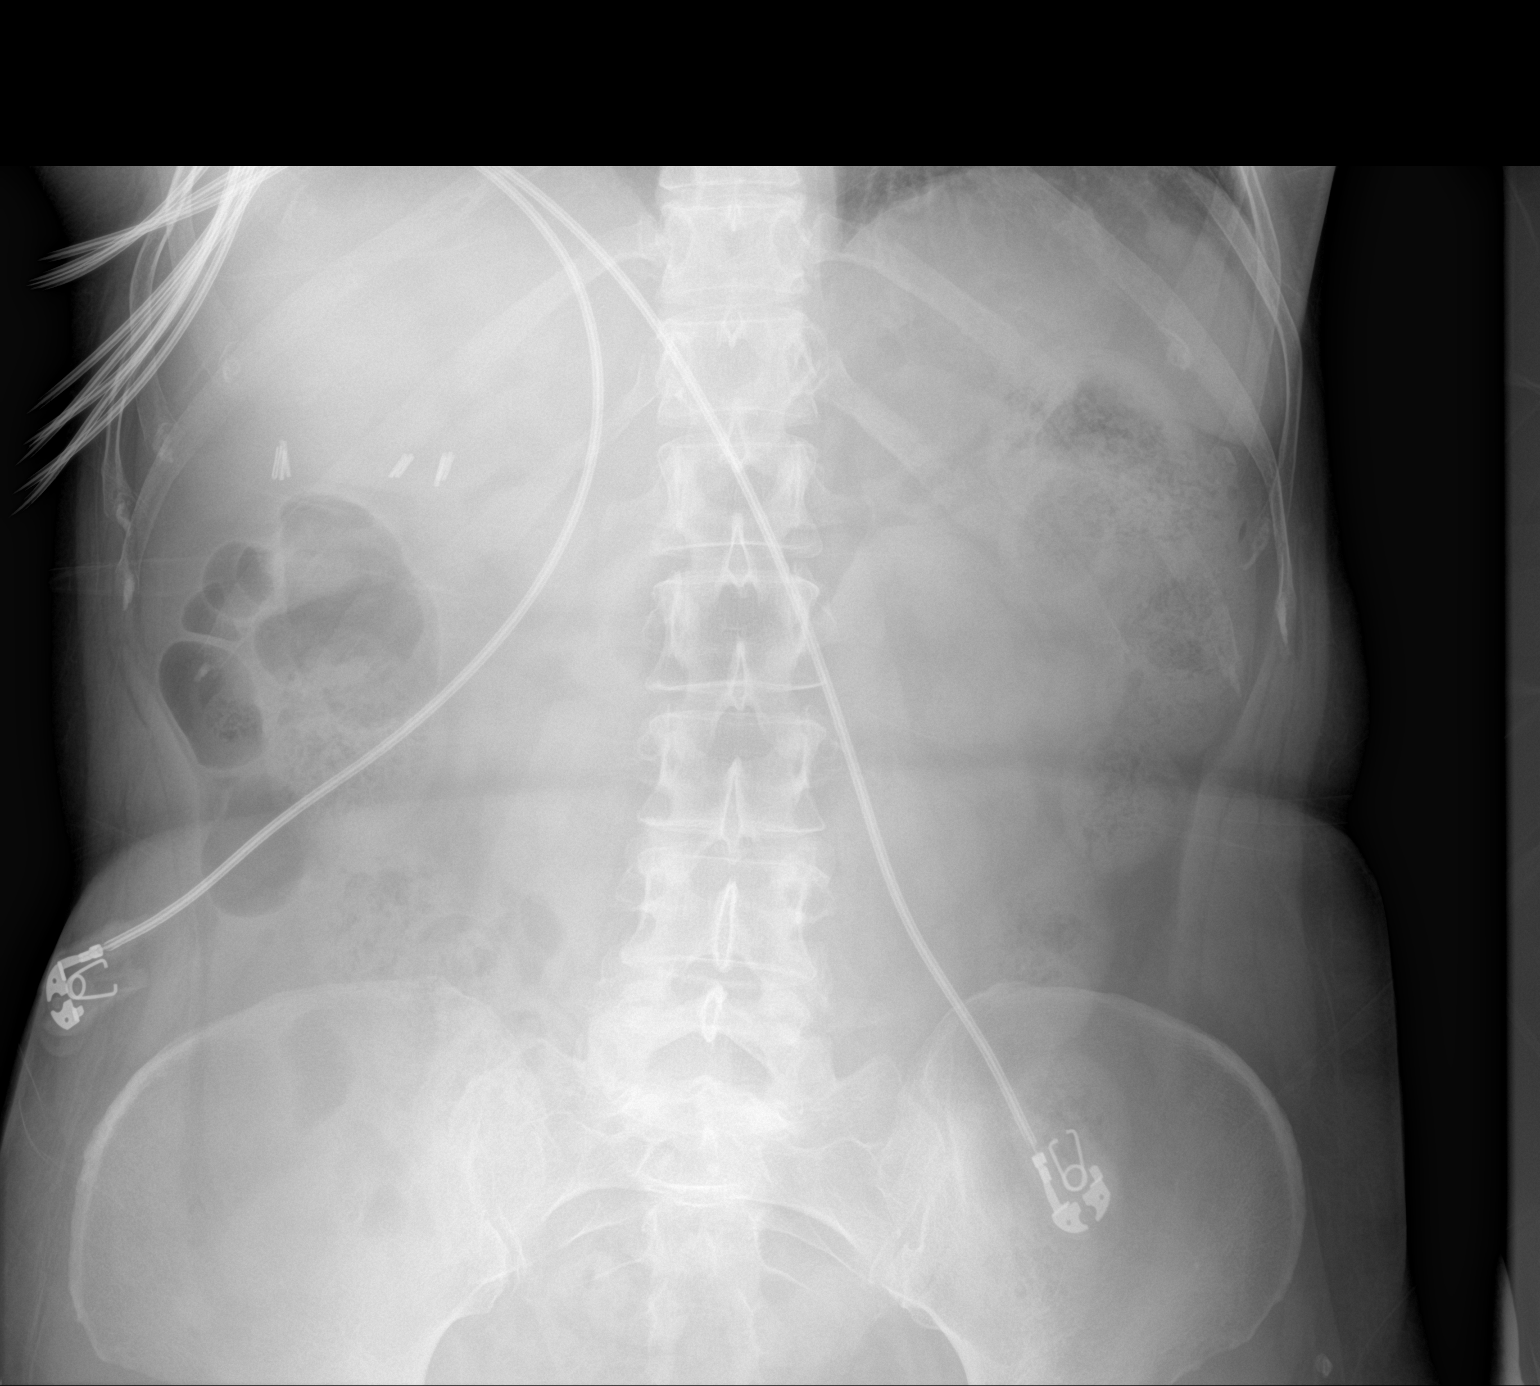
[im 2/2]
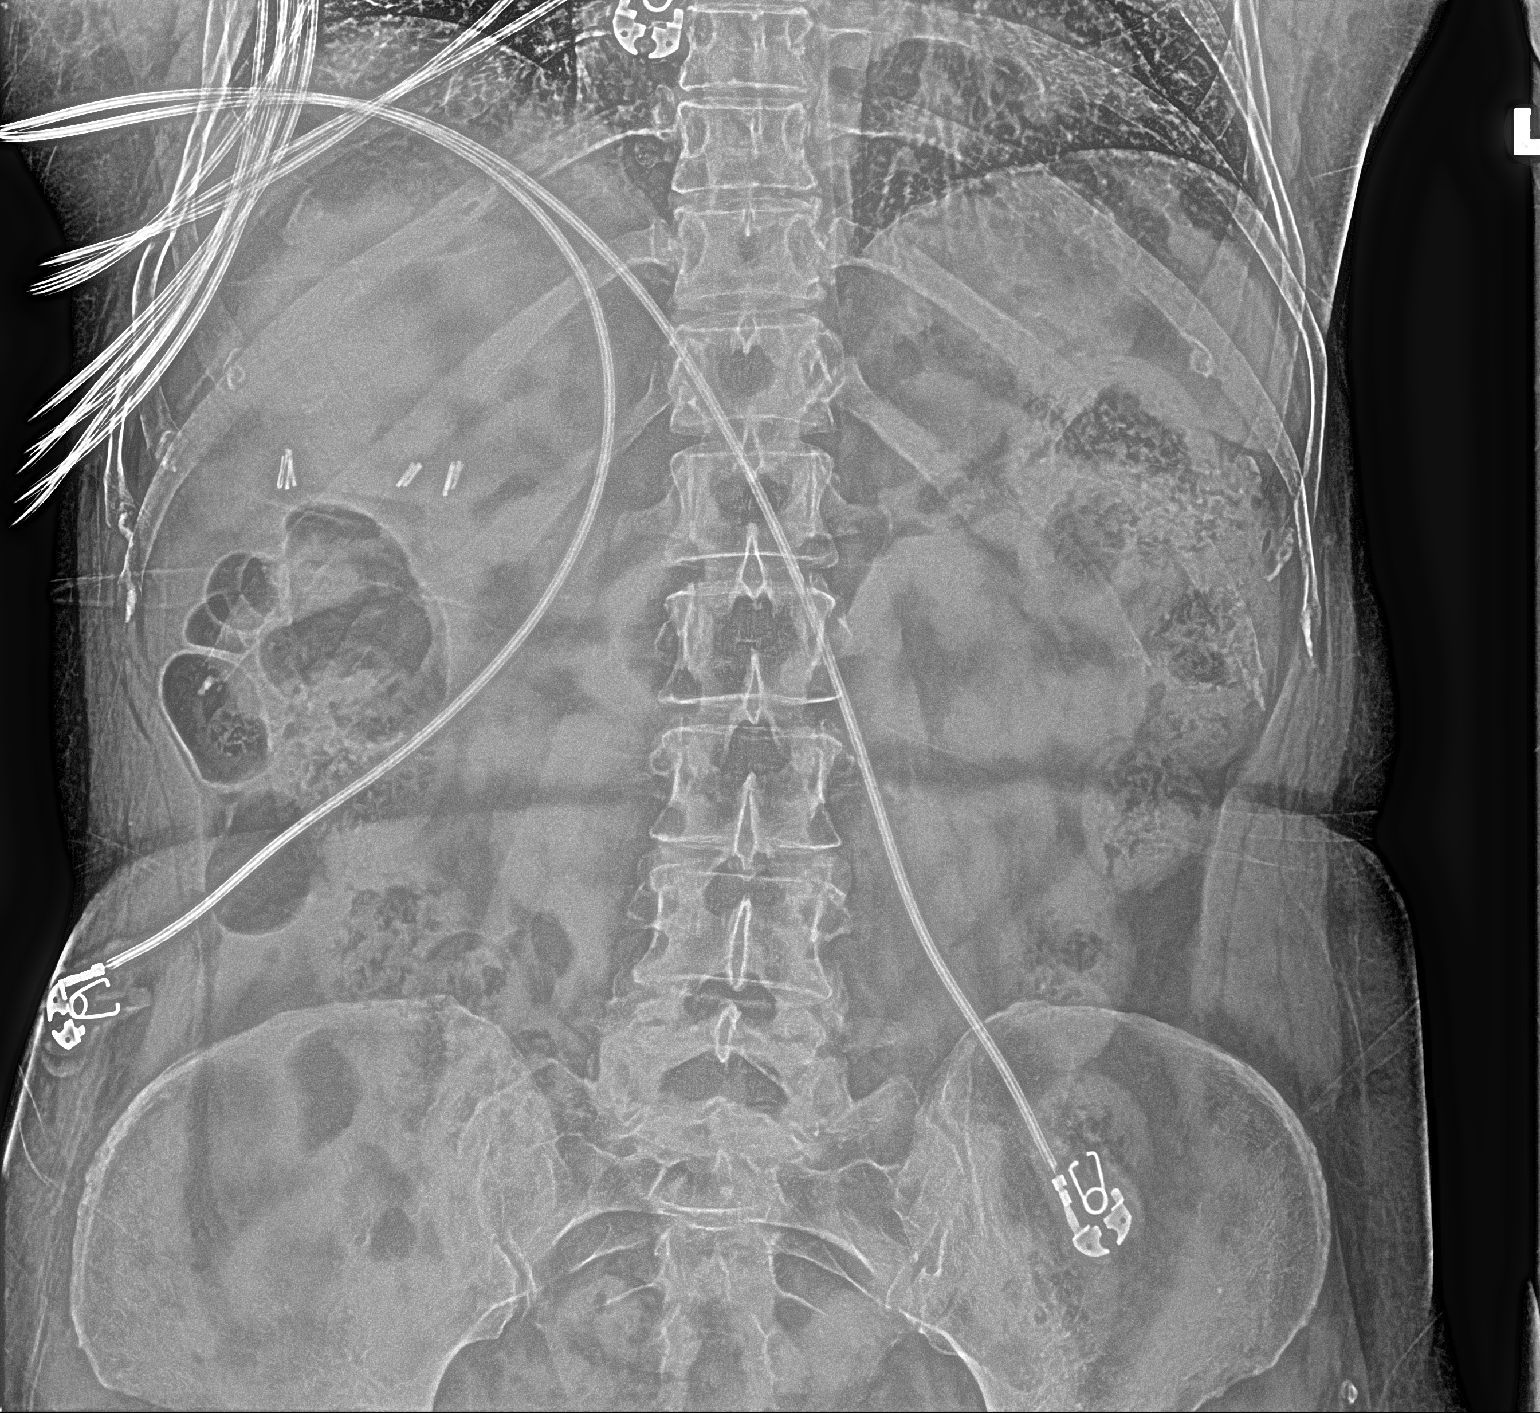

[2 of 2 positions shown; findings below may reference images not displayed]

FINDINGS: Surgical clips over the right upper quadrant. Bowel gas pattern is
nonobstructive with mild fecal retention throughout the colon. No
free peritoneal air. Bones and soft tissues are otherwise
unremarkable.
IMPRESSION: Nonobstructive bowel gas pattern.
